# Patient Record
Sex: Female | Born: 1985 | Race: White | Hispanic: No | Marital: Single | Smoking: Current every day smoker
Health system: Southern US, Community
[De-identification: ages and names within clinical notes are randomized; demographics above are authoritative.]

## PROBLEM LIST (undated history)

## (undated) DIAGNOSIS — F32A Depression, unspecified: Secondary | ICD-10-CM

## (undated) DIAGNOSIS — Z8619 Personal history of other infectious and parasitic diseases: Secondary | ICD-10-CM

## (undated) DIAGNOSIS — Z8711 Personal history of peptic ulcer disease: Secondary | ICD-10-CM

## (undated) DIAGNOSIS — F1910 Other psychoactive substance abuse, uncomplicated: Secondary | ICD-10-CM

## (undated) DIAGNOSIS — F1920 Other psychoactive substance dependence, uncomplicated: Secondary | ICD-10-CM

## (undated) DIAGNOSIS — E785 Hyperlipidemia, unspecified: Secondary | ICD-10-CM

## (undated) DIAGNOSIS — R519 Headache, unspecified: Secondary | ICD-10-CM

## (undated) DIAGNOSIS — J45909 Unspecified asthma, uncomplicated: Secondary | ICD-10-CM

## (undated) DIAGNOSIS — M1990 Unspecified osteoarthritis, unspecified site: Secondary | ICD-10-CM

## (undated) HISTORY — DX: Personal history of peptic ulcer disease: Z87.11

## (undated) HISTORY — DX: Depression, unspecified: F32.A

## (undated) HISTORY — PX: NOSE SURGERY: SHX723

## (undated) HISTORY — DX: Unspecified osteoarthritis, unspecified site: M19.90

## (undated) HISTORY — PX: CHOLECYSTECTOMY: SHX55

## (undated) HISTORY — DX: Headache, unspecified: R51.9

## (undated) HISTORY — DX: Personal history of other infectious and parasitic diseases: Z86.19

## (undated) HISTORY — DX: Hyperlipidemia, unspecified: E78.5

## (undated) HISTORY — DX: Other psychoactive substance abuse, uncomplicated: F19.10

## (undated) HISTORY — PX: GANGLION CYST EXCISION: SHX1691

---

## 2004-09-23 ENCOUNTER — Emergency Department: Payer: Self-pay | Admitting: Emergency Medicine

## 2008-04-16 ENCOUNTER — Emergency Department: Payer: Self-pay | Admitting: Emergency Medicine

## 2008-12-25 ENCOUNTER — Emergency Department: Payer: Self-pay | Admitting: Emergency Medicine

## 2009-07-13 ENCOUNTER — Emergency Department: Payer: Self-pay

## 2009-10-25 ENCOUNTER — Emergency Department: Payer: Self-pay | Admitting: Emergency Medicine

## 2009-12-03 ENCOUNTER — Inpatient Hospital Stay: Payer: Self-pay | Admitting: Unknown Physician Specialty

## 2009-12-19 ENCOUNTER — Inpatient Hospital Stay: Payer: Self-pay | Admitting: Unknown Physician Specialty

## 2010-02-08 ENCOUNTER — Emergency Department: Payer: Self-pay | Admitting: Internal Medicine

## 2010-11-01 ENCOUNTER — Emergency Department: Payer: Self-pay | Admitting: Emergency Medicine

## 2011-10-09 ENCOUNTER — Emergency Department: Payer: Self-pay | Admitting: Emergency Medicine

## 2011-10-09 LAB — COMPREHENSIVE METABOLIC PANEL
Albumin: 3.6 g/dL (ref 3.4–5.0)
Anion Gap: 10 (ref 7–16)
BUN: 12 mg/dL (ref 7–18)
Bilirubin,Total: 0.1 mg/dL — ABNORMAL LOW (ref 0.2–1.0)
Chloride: 106 mmol/L (ref 98–107)
Co2: 28 mmol/L (ref 21–32)
Creatinine: 0.76 mg/dL (ref 0.60–1.30)
EGFR (African American): >60
EGFR (Non-African Amer.): >60
Osmolality: 287 (ref 275–301)
Potassium: 4.3 mmol/L (ref 3.5–5.1)
SGPT (ALT): 17 U/L
Sodium: 144 mmol/L (ref 136–145)
Total Protein: 7.6 g/dL (ref 6.4–8.2)

## 2011-10-09 LAB — ETHANOL: Ethanol: <3 mg/dL

## 2011-10-09 LAB — CBC
HCT: 39.5 % (ref 35.0–47.0)
HGB: 13.1 g/dL (ref 12.0–16.0)
MCV: 96 fL (ref 80–100)
RBC: 4.1 10*6/uL (ref 3.80–5.20)
RDW: 14.7 % — ABNORMAL HIGH (ref 11.5–14.5)
WBC: 7.6 10*3/uL (ref 3.6–11.0)

## 2011-10-09 LAB — TSH: Thyroid Stimulating Horm: 4 u[IU]/mL

## 2011-10-09 LAB — ACETAMINOPHEN LEVEL: Acetaminophen: <2 ug/mL

## 2012-02-28 ENCOUNTER — Emergency Department: Payer: Self-pay | Admitting: Emergency Medicine

## 2012-02-28 LAB — COMPREHENSIVE METABOLIC PANEL
Anion Gap: 8 (ref 7–16)
Bilirubin,Total: 0.2 mg/dL (ref 0.2–1.0)
EGFR (African American): >60
EGFR (Non-African Amer.): >60
Glucose: 117 mg/dL — ABNORMAL HIGH (ref 65–99)
Osmolality: 286 (ref 275–301)
Potassium: 3.5 mmol/L (ref 3.5–5.1)
SGOT(AST): 34 U/L (ref 15–37)
SGPT (ALT): 45 U/L
Sodium: 142 mmol/L (ref 136–145)
Total Protein: 7.8 g/dL (ref 6.4–8.2)

## 2012-02-28 LAB — DRUG SCREEN, URINE
Amphetamines, Ur Screen: NEGATIVE (ref ?–1000)
Barbiturates, Ur Screen: NEGATIVE (ref ?–200)
Benzodiazepine, Ur Scrn: NEGATIVE (ref ?–200)
Cocaine Metabolite,Ur ~~LOC~~: POSITIVE (ref ?–300)
Opiate, Ur Screen: NEGATIVE (ref ?–300)
Tricyclic, Ur Screen: NEGATIVE (ref ?–1000)

## 2012-02-28 LAB — URINALYSIS, COMPLETE
Bacteria: NONE SEEN
Blood: NEGATIVE
Glucose,UR: NEGATIVE mg/dL (ref 0–75)
Ketone: NEGATIVE
Ph: 5 (ref 4.5–8.0)
RBC,UR: 6 /HPF (ref 0–5)

## 2012-02-28 LAB — CBC
MCV: 97 fL (ref 80–100)
Platelet: 395 10*3/uL (ref 150–440)
RBC: 3.94 10*6/uL (ref 3.80–5.20)

## 2012-02-28 LAB — ETHANOL: Ethanol %: <0.003 % (ref 0.000–0.080)

## 2012-04-08 LAB — CBC
HCT: 31.9 % — ABNORMAL LOW (ref 35.0–47.0)
HGB: 10.3 g/dL — ABNORMAL LOW (ref 12.0–16.0)
MCHC: 32.3 g/dL (ref 32.0–36.0)
RBC: 3.27 10*6/uL — ABNORMAL LOW (ref 3.80–5.20)
WBC: 8.4 10*3/uL (ref 3.6–11.0)

## 2012-04-08 LAB — COMPREHENSIVE METABOLIC PANEL
Albumin: 3.3 g/dL — ABNORMAL LOW (ref 3.4–5.0)
Alkaline Phosphatase: 57 U/L (ref 50–136)
BUN: 22 mg/dL — ABNORMAL HIGH (ref 7–18)
Calcium, Total: 8.2 mg/dL — ABNORMAL LOW (ref 8.5–10.1)
Co2: 27 mmol/L (ref 21–32)
Creatinine: 1.04 mg/dL (ref 0.60–1.30)
Glucose: 107 mg/dL — ABNORMAL HIGH (ref 65–99)
Osmolality: 293 (ref 275–301)
Potassium: 3.1 mmol/L — ABNORMAL LOW (ref 3.5–5.1)
SGOT(AST): 29 U/L (ref 15–37)
SGPT (ALT): 38 U/L
Sodium: 145 mmol/L (ref 136–145)

## 2012-04-08 LAB — TSH: Thyroid Stimulating Horm: 3.08 u[IU]/mL

## 2012-04-08 LAB — ETHANOL
Ethanol %: <0.003 % (ref 0.000–0.080)
Ethanol: <3 mg/dL

## 2012-04-08 LAB — ACETAMINOPHEN LEVEL: Acetaminophen: <2 ug/mL

## 2012-04-08 LAB — SALICYLATE LEVEL: Salicylates, Serum: <1.7 mg/dL

## 2012-04-09 ENCOUNTER — Inpatient Hospital Stay: Payer: Self-pay | Admitting: Psychiatry

## 2012-04-09 LAB — POTASSIUM: Potassium: 3.3 mmol/L — ABNORMAL LOW (ref 3.5–5.1)

## 2012-04-09 LAB — DRUG SCREEN, URINE
Barbiturates, Ur Screen: NEGATIVE (ref ?–200)
Benzodiazepine, Ur Scrn: NEGATIVE (ref ?–200)
Cannabinoid 50 Ng, Ur ~~LOC~~: NEGATIVE (ref ?–50)
Phencyclidine (PCP) Ur S: NEGATIVE (ref ?–25)

## 2012-04-09 LAB — URINALYSIS, COMPLETE
Nitrite: NEGATIVE
Protein: NEGATIVE
RBC,UR: 2 /HPF (ref 0–5)
Squamous Epithelial: 6

## 2012-04-09 LAB — PREGNANCY, URINE: Pregnancy Test, Urine: NEGATIVE m[IU]/mL

## 2012-04-10 LAB — LIPID PANEL
HDL Cholesterol: 40 mg/dL (ref 40–60)
Triglycerides: 117 mg/dL (ref 0–200)

## 2012-04-26 ENCOUNTER — Inpatient Hospital Stay: Payer: Self-pay | Admitting: Psychiatry

## 2012-04-26 LAB — CBC
HCT: 35.7 % (ref 35.0–47.0)
HGB: 12.2 g/dL (ref 12.0–16.0)
MCH: 32.3 pg (ref 26.0–34.0)
MCHC: 34 g/dL (ref 32.0–36.0)
MCV: 95 fL (ref 80–100)
Platelet: 379 10*3/uL (ref 150–440)
RBC: 3.77 10*6/uL — ABNORMAL LOW (ref 3.80–5.20)
RDW: 14.6 % — ABNORMAL HIGH (ref 11.5–14.5)
WBC: 12.1 10*3/uL — ABNORMAL HIGH (ref 3.6–11.0)

## 2012-04-26 LAB — VALPROIC ACID LEVEL: Valproic Acid: 19 ug/mL — ABNORMAL LOW

## 2012-04-26 LAB — ACETAMINOPHEN LEVEL: Acetaminophen: <2 ug/mL

## 2012-04-26 LAB — COMPREHENSIVE METABOLIC PANEL
Anion Gap: 8 (ref 7–16)
BUN: 22 mg/dL — ABNORMAL HIGH (ref 7–18)
Chloride: 105 mmol/L (ref 98–107)
Co2: 24 mmol/L (ref 21–32)
Creatinine: 0.83 mg/dL (ref 0.60–1.30)
EGFR (African American): >60
Potassium: 3.7 mmol/L (ref 3.5–5.1)
SGOT(AST): 38 U/L — ABNORMAL HIGH (ref 15–37)
Total Protein: 8.1 g/dL (ref 6.4–8.2)

## 2012-04-26 LAB — TSH: Thyroid Stimulating Horm: 3.14 u[IU]/mL

## 2012-04-26 LAB — ETHANOL
Ethanol %: <0.003 % (ref 0.000–0.080)
Ethanol: <3 mg/dL

## 2012-04-27 LAB — WBC: WBC: 6.5 10*3/uL (ref 3.6–11.0)

## 2012-04-27 LAB — URINALYSIS, COMPLETE
Bacteria: NONE SEEN
Bilirubin,UR: NEGATIVE
Blood: NEGATIVE
Glucose,UR: NEGATIVE mg/dL (ref 0–75)
Ketone: NEGATIVE
Leukocyte Esterase: NEGATIVE
Nitrite: NEGATIVE
Ph: 7 (ref 4.5–8.0)
Squamous Epithelial: 4
WBC UR: <1 /HPF (ref 0–5)

## 2012-05-01 LAB — VALPROIC ACID LEVEL: Valproic Acid: 61 ug/mL

## 2012-10-24 ENCOUNTER — Inpatient Hospital Stay: Payer: Self-pay | Admitting: Psychiatry

## 2012-10-24 LAB — CBC WITH DIFFERENTIAL/PLATELET
Eosinophil %: 0.7 %
HCT: 35 % (ref 35.0–47.0)
HGB: 11.7 g/dL — ABNORMAL LOW (ref 12.0–16.0)
Lymphocyte #: 3.2 10*3/uL (ref 1.0–3.6)
Lymphocyte %: 23.8 %
MCH: 32.1 pg (ref 26.0–34.0)
MCHC: 33.5 g/dL (ref 32.0–36.0)
MCV: 96 fL (ref 80–100)
Monocyte %: 5.2 %
Neutrophil #: 9.3 10*3/uL — ABNORMAL HIGH (ref 1.4–6.5)
RBC: 3.65 10*6/uL — ABNORMAL LOW (ref 3.80–5.20)
WBC: 13.4 10*3/uL — ABNORMAL HIGH (ref 3.6–11.0)

## 2012-10-24 LAB — DIFFERENTIAL
Basophil %: 0.9 %
Eosinophil #: 0 10*3/uL (ref 0.0–0.7)
Eosinophil %: 0.2 %
Lymphocyte %: 11.3 %
Monocyte #: 0.8 x10 3/mm (ref 0.2–0.9)
Monocyte %: 3.9 %

## 2012-10-24 LAB — COMPREHENSIVE METABOLIC PANEL
Alkaline Phosphatase: 87 U/L (ref 50–136)
Anion Gap: 9 (ref 7–16)
Calcium, Total: 8.5 mg/dL (ref 8.5–10.1)
Co2: 21 mmol/L (ref 21–32)
Creatinine: 0.65 mg/dL (ref 0.60–1.30)
EGFR (Non-African Amer.): >60
Glucose: 99 mg/dL (ref 65–99)
Osmolality: 278 (ref 275–301)
SGOT(AST): 46 U/L — ABNORMAL HIGH (ref 15–37)
SGPT (ALT): 55 U/L (ref 12–78)
Sodium: 140 mmol/L (ref 136–145)

## 2012-10-24 LAB — URINALYSIS, COMPLETE
Bilirubin,UR: NEGATIVE
Blood: NEGATIVE
Glucose,UR: NEGATIVE mg/dL (ref 0–75)
Leukocyte Esterase: NEGATIVE
Ph: 6 (ref 4.5–8.0)
Protein: 30
RBC,UR: 4 /HPF (ref 0–5)

## 2012-10-24 LAB — CBC
HCT: 38.2 % (ref 35.0–47.0)
HGB: 12.9 g/dL (ref 12.0–16.0)
MCV: 94 fL (ref 80–100)
Platelet: 415 10*3/uL (ref 150–440)
RBC: 4.05 10*6/uL (ref 3.80–5.20)
WBC: 20 10*3/uL — ABNORMAL HIGH (ref 3.6–11.0)

## 2012-10-24 LAB — DRUG SCREEN, URINE
Amphetamines, Ur Screen: NEGATIVE (ref ?–1000)
Barbiturates, Ur Screen: NEGATIVE (ref ?–200)
Tricyclic, Ur Screen: NEGATIVE (ref ?–1000)

## 2012-10-24 LAB — ETHANOL
Ethanol %: <0.003 % (ref 0.000–0.080)
Ethanol: <3 mg/dL

## 2012-10-24 LAB — ACETAMINOPHEN LEVEL: Acetaminophen: 7 ug/mL — ABNORMAL LOW

## 2012-10-24 LAB — TSH: Thyroid Stimulating Horm: 2.56 u[IU]/mL

## 2012-10-24 LAB — SALICYLATE LEVEL: Salicylates, Serum: 3 mg/dL — ABNORMAL HIGH

## 2012-10-28 LAB — VALPROIC ACID LEVEL: Valproic Acid: 89 ug/mL

## 2012-12-20 ENCOUNTER — Inpatient Hospital Stay: Payer: Self-pay | Admitting: Psychiatry

## 2012-12-20 LAB — URINALYSIS, COMPLETE
Bilirubin,UR: NEGATIVE
Glucose,UR: NEGATIVE mg/dL (ref 0–75)
Ketone: NEGATIVE
Nitrite: POSITIVE
Ph: 5 (ref 4.5–8.0)
RBC,UR: 1 /HPF (ref 0–5)
Squamous Epithelial: 2

## 2012-12-20 LAB — COMPREHENSIVE METABOLIC PANEL
Alkaline Phosphatase: 66 U/L (ref 50–136)
Chloride: 108 mmol/L — ABNORMAL HIGH (ref 98–107)
Co2: 25 mmol/L (ref 21–32)
Creatinine: 0.89 mg/dL (ref 0.60–1.30)
Glucose: 106 mg/dL — ABNORMAL HIGH (ref 65–99)
Osmolality: 283 (ref 275–301)
SGOT(AST): 28 U/L (ref 15–37)
SGPT (ALT): 45 U/L (ref 12–78)
Sodium: 141 mmol/L (ref 136–145)
Total Protein: 7.9 g/dL (ref 6.4–8.2)

## 2012-12-20 LAB — CBC
HCT: 41.5 % (ref 35.0–47.0)
HGB: 13.9 g/dL (ref 12.0–16.0)
MCH: 33.1 pg (ref 26.0–34.0)
MCHC: 33.6 g/dL (ref 32.0–36.0)
MCV: 99 fL (ref 80–100)
RDW: 14.7 % — ABNORMAL HIGH (ref 11.5–14.5)
WBC: 7.1 10*3/uL (ref 3.6–11.0)

## 2012-12-20 LAB — DRUG SCREEN, URINE
Barbiturates, Ur Screen: NEGATIVE (ref ?–200)
Benzodiazepine, Ur Scrn: NEGATIVE (ref ?–200)
MDMA (Ecstasy)Ur Screen: NEGATIVE (ref ?–500)
Opiate, Ur Screen: NEGATIVE (ref ?–300)

## 2012-12-20 LAB — TSH: Thyroid Stimulating Horm: 1.79 u[IU]/mL

## 2012-12-20 LAB — ETHANOL
Ethanol %: <0.003 % (ref 0.000–0.080)
Ethanol: <3 mg/dL

## 2012-12-22 LAB — URINE CULTURE

## 2012-12-24 LAB — VALPROIC ACID LEVEL: Valproic Acid: 63 ug/mL

## 2013-03-17 ENCOUNTER — Emergency Department (HOSPITAL_COMMUNITY)
Admission: EM | Admit: 2013-03-17 | Discharge: 2013-03-17 | Disposition: A | Discharge status: Home / Self Care | Payer: Self-pay | Attending: Emergency Medicine | Admitting: Emergency Medicine

## 2013-03-17 ENCOUNTER — Encounter (HOSPITAL_COMMUNITY): Payer: Self-pay | Admitting: Emergency Medicine

## 2013-03-17 DIAGNOSIS — B070 Plantar wart: Secondary | ICD-10-CM | POA: Insufficient documentation

## 2013-03-17 DIAGNOSIS — F17200 Nicotine dependence, unspecified, uncomplicated: Secondary | ICD-10-CM | POA: Insufficient documentation

## 2013-03-17 DIAGNOSIS — G8929 Other chronic pain: Secondary | ICD-10-CM | POA: Insufficient documentation

## 2013-03-17 NOTE — ED Notes (Signed)
Pt c/o pain in left foot and states "I have a plantar wart on the bottom of my foot and it's bothering me". Pt also reprots sharp shooting pain on the side of her foot.

## 2013-03-17 NOTE — ED Provider Notes (Signed)
   History    CSN: [id number] Arrival date & time 03/17/13  1478  First MD Initiated Contact with Patient 03/17/13 2008     Chief Complaint  Patient presents with  . Foot Pain   (Consider location/radiation/quality/duration/timing/severity/associated sxs/prior Treatment) Patient is a 27 y.o. female presenting with lower extremity pain. The history is provided by the patient.  Foot Pain This is a chronic problem. The current episode started more than 1 year ago. The problem has been gradually worsening. Pertinent negatives include no chills, coughing, fever, nausea, neck pain or vomiting. The symptoms are aggravated by standing and walking. She has tried nothing for the symptoms.   Rebecca Mcclure is a 27 y.o. female who presents to the ED with "plantar's wart" to the left foot. It has been there for years but recently became painful.    History reviewed. No pertinent past medical history. History reviewed. No pertinent past surgical history. No family history on file. History  Substance Use Topics  . Smoking status: Current Every Day Smoker  . Smokeless tobacco: Not on file  . Alcohol Use: No   OB History   Grav Para Term Preterm Abortions TAB SAB Ect Mult Living                 Review of Systems  Constitutional: Negative for fever and chills.  HENT: Negative for neck pain.   Respiratory: Negative for cough.   Gastrointestinal: Negative for nausea and vomiting.  Musculoskeletal: Gait problem: due to pain.       Left foot pain  Skin: Wound: plantar wart.  Psychiatric/Behavioral: The patient is not nervous/anxious.     Allergies  Review of patient's allergies indicates no known allergies.  Home Medications   Current Outpatient Rx  Name  Route  Sig  Dispense  Refill  . divalproex (DEPAKOTE) 500 MG DR tablet   Oral   Take 500 mg by mouth 2 (two) times daily.          BP 128/76  Pulse 91  Temp(Src) 98.4 F (36.9 C) (Oral)  Resp 24  Ht 5\' 4"  (1.626 m)  Wt 140 lb  (63.504 kg)  BMI 24.02 kg/m2  SpO2 100%  LMP 02/14/2013 Physical Exam  Nursing note and vitals reviewed. Constitutional: She is oriented to person, place, and time. She appears well-developed and well-nourished. No distress.  HENT:  Head: Normocephalic.  Eyes: EOM are normal.  Neck: Neck supple.  Cardiovascular: Normal rate.   Pulmonary/Chest: Effort normal.  Musculoskeletal:       Left foot: She exhibits tenderness (plantar aspect). She exhibits normal range of motion, no swelling, normal capillary refill and no deformity.       Feet:  Approximately 2 cm tender raised firm area plantar aspect of left foot at base of 4th and 5th toes.   Neurological: She is alert and oriented to person, place, and time. No cranial nerve deficit.  Skin: Skin is warm and dry.  Psychiatric: She has a normal mood and affect. Her behavior is normal.    ED Course  Procedures  MDM  27 y.o. female with plantar wart of left foot. Will try wart pad for comfort and follow up with Dr. Charlsie Merles.  Discussed with the patient clinical findings and plan of care and all questioned fully answered.    Janne Napoleon, NP 03/17/13 2025

## 2013-03-17 NOTE — ED Notes (Signed)
Patient c/o "plantar's wart" on left foot.  Patient states it has been there for years, but recently started getting painful.

## 2013-03-18 NOTE — ED Provider Notes (Signed)
Medical screening examination/treatment/procedure(s) were performed by non-physician practitioner and as supervising physician I was immediately available for consultation/collaboration.   Carleene Cooper III, MD 03/18/13 [phone]

## 2013-06-20 ENCOUNTER — Encounter (HOSPITAL_COMMUNITY): Payer: Self-pay | Admitting: *Deleted

## 2013-06-20 ENCOUNTER — Emergency Department (HOSPITAL_COMMUNITY)
Admission: EM | Admit: 2013-06-20 | Discharge: 2013-06-21 | Disposition: A | Discharge status: Home / Self Care | Payer: Self-pay | Attending: Emergency Medicine | Admitting: Emergency Medicine

## 2013-06-20 DIAGNOSIS — Z0283 Encounter for blood-alcohol and blood-drug test: Secondary | ICD-10-CM

## 2013-06-20 DIAGNOSIS — K5289 Other specified noninfective gastroenteritis and colitis: Secondary | ICD-10-CM | POA: Insufficient documentation

## 2013-06-20 DIAGNOSIS — Z79899 Other long term (current) drug therapy: Secondary | ICD-10-CM | POA: Insufficient documentation

## 2013-06-20 DIAGNOSIS — Z1389 Encounter for screening for other disorder: Secondary | ICD-10-CM | POA: Insufficient documentation

## 2013-06-20 DIAGNOSIS — F17200 Nicotine dependence, unspecified, uncomplicated: Secondary | ICD-10-CM | POA: Insufficient documentation

## 2013-06-20 DIAGNOSIS — K529 Noninfective gastroenteritis and colitis, unspecified: Secondary | ICD-10-CM

## 2013-06-20 NOTE — ED Notes (Signed)
Pt c/o diarrhea and nausea since last night. 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 States there is an ingredient in Zantac that causes the false positive.

## 2013-06-20 NOTE — ED Provider Notes (Signed)
CSN: [id number]     Arrival date & time 06/20/13  2226 History   First MD Initiated Contact with Patient 06/20/13 2248     Chief Complaint  Patient presents with  . Diarrhea  . 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 She request a drug screen to prevent being forced out of the half way house.  Patient is a 27 y.o. female presenting with diarrhea. The history is provided by the patient.  Diarrhea Quality:  Unable to specify Severity:  Moderate Onset quality:  Gradual Duration:  1 day Timing:  Intermittent Progression:  Worsening Relieved by:  Nothing Ineffective treatments:  None tried Associated symptoms: no abdominal pain and no arthralgias   Risk factors: no recent antibiotic use and no sick contacts     History reviewed. No pertinent past medical history. History reviewed. No pertinent past surgical history. History reviewed. No pertinent family history. History  Substance Use Topics  . Smoking status: Current Every Day Smoker  . Smokeless tobacco: Not on file  . Alcohol Use: No   OB History   Grav Para Term Preterm Abortions TAB SAB Ect Mult Living                 Review of Systems  Constitutional: Negative for activity change.       All ROS Neg except as noted in HPI  HENT: Negative for nosebleeds and neck pain.   Eyes: Negative for photophobia and discharge.  Respiratory: Negative for cough, shortness of breath and wheezing.   Cardiovascular: Negative for chest pain and palpitations.  Gastrointestinal: Positive for nausea and diarrhea. Negative for abdominal pain and blood in stool.  Genitourinary: Negative for dysuria, frequency and hematuria.  Musculoskeletal: Negative for back pain and arthralgias.  Skin: Negative.   Neurological: Negative for dizziness, seizures and speech difficulty.   Psychiatric/Behavioral: Negative for hallucinations and confusion.    Allergies  Review of patient's allergies indicates no known allergies.  Home Medications   Current Outpatient Rx  Name  Route  Sig  Dispense  Refill  . divalproex (DEPAKOTE) 500 MG DR tablet   Oral   Take 500 mg by mouth 2 (two) times daily.         . ranitidine (ZANTAC) 150 MG tablet   Oral   Take 150 mg by mouth 2 (two) times daily.          BP 117/75  Pulse 83  Temp(Src) 98.3 F (36.8 C) (Oral)  Resp 24  Ht 5\' 3"  (1.6 m)  Wt 155 lb 11.2 oz (70.625 kg)  BMI 27.59 kg/m2  SpO2 99%  LMP 06/16/2013 Physical Exam  Nursing note and vitals reviewed. Constitutional: She is oriented to person, place, and time. She appears well-developed and well-nourished.  Non-toxic appearance.  HENT:  Head: Normocephalic.  Right Ear: Tympanic membrane and external ear normal.  Left Ear: Tympanic membrane and external ear normal.  Eyes: EOM and lids are normal. Pupils are equal, round, and reactive to light.  Neck: Normal range of motion. Neck supple. Carotid bruit is not present.  Cardiovascular: Normal rate, regular rhythm, normal heart sounds, intact distal pulses and normal pulses.   Pulmonary/Chest: Breath sounds normal. No respiratory distress.  Abdominal: Soft. Bowel sounds are normal. There is no tenderness. There is no guarding.  Musculoskeletal: Normal range of motion.  Lymphadenopathy:       Head (  right side): No submandibular adenopathy present.       Head (left side): No submandibular adenopathy present.    She has no cervical adenopathy.  Neurological: She is alert and oriented to person, place, and time. She has normal strength. No cranial nerve deficit or sensory deficit.  Skin: Skin is warm and dry.  Psychiatric: She has a normal mood and affect. Her speech is normal.    ED Course  Procedures (including critical care time) Labs Review Labs Reviewed - No data to display Imaging Review No  results found.  MDM  No diagnosis found. *I have reviewed nursing notes, vital signs, and all appropriate lab and imaging results for this patient.**  Requested drug screen negative. No diarrhea in ED. Zofran given in ED. No vomiting. Pt drinking liquids in ED. Safe for d/c. Pt to return if any changes or problem.  Kathie Dike, PA-C 06/22/13 [phone]

## 2013-06-21 LAB — RAPID URINE DRUG SCREEN, HOSP PERFORMED
Barbiturates: NOT DETECTED
Benzodiazepines: NOT DETECTED
Cocaine: NOT DETECTED
Opiates: NOT DETECTED
Tetrahydrocannabinol: NOT DETECTED

## 2013-06-21 MED ORDER — ONDANSETRON HCL 4 MG PO TABS
4.0000 mg | ORAL_TABLET | Freq: Once | ORAL | Status: AC
  Administered 2013-06-21: 4 mg via ORAL
  Filled 2013-06-21: qty 1

## 2013-06-24 NOTE — ED Provider Notes (Signed)
Medical screening examination/treatment/procedure(s) were performed by non-physician practitioner and as supervising physician I was immediately available for consultation/collaboration.    Vida Roller, MD 06/24/13 [phone]

## 2013-07-06 ENCOUNTER — Emergency Department (HOSPITAL_COMMUNITY)
Admission: EM | Admit: 2013-07-06 | Discharge: 2013-07-06 | Disposition: A | Discharge status: Home / Self Care | Payer: Self-pay | Attending: Emergency Medicine | Admitting: Emergency Medicine

## 2013-07-06 ENCOUNTER — Encounter (HOSPITAL_COMMUNITY): Payer: Self-pay | Admitting: Emergency Medicine

## 2013-07-06 DIAGNOSIS — IMO0002 Reserved for concepts with insufficient information to code with codable children: Secondary | ICD-10-CM | POA: Insufficient documentation

## 2013-07-06 DIAGNOSIS — Z79899 Other long term (current) drug therapy: Secondary | ICD-10-CM | POA: Insufficient documentation

## 2013-07-06 DIAGNOSIS — J029 Acute pharyngitis, unspecified: Secondary | ICD-10-CM | POA: Insufficient documentation

## 2013-07-06 DIAGNOSIS — J9801 Acute bronchospasm: Secondary | ICD-10-CM

## 2013-07-06 DIAGNOSIS — J40 Bronchitis, not specified as acute or chronic: Secondary | ICD-10-CM

## 2013-07-06 DIAGNOSIS — J45901 Unspecified asthma with (acute) exacerbation: Secondary | ICD-10-CM | POA: Insufficient documentation

## 2013-07-06 DIAGNOSIS — F17200 Nicotine dependence, unspecified, uncomplicated: Secondary | ICD-10-CM | POA: Insufficient documentation

## 2013-07-06 HISTORY — DX: Unspecified asthma, uncomplicated: J45.909

## 2013-07-06 MED ORDER — ALBUTEROL SULFATE HFA 108 (90 BASE) MCG/ACT IN AERS: 1.0000 | INHALATION_SPRAY | Freq: Four times a day (QID) | RESPIRATORY_TRACT | Status: DC | PRN

## 2013-07-06 MED ORDER — AZITHROMYCIN 250 MG PO TABS: ORAL_TABLET | ORAL | Status: DC

## 2013-07-06 MED ORDER — PREDNISONE 10 MG PO TABS: 20.0000 mg | ORAL_TABLET | Freq: Every day | ORAL | Status: DC

## 2013-07-06 MED ORDER — HYDROXYZINE HCL 25 MG PO TABS: 25.0000 mg | ORAL_TABLET | Freq: Every evening | ORAL | Status: DC | PRN

## 2013-07-06 MED ORDER — PREDNISONE 50 MG PO TABS
60.0000 mg | ORAL_TABLET | Freq: Once | ORAL | Status: AC
  Administered 2013-07-06: 60 mg via ORAL
  Filled 2013-07-06 (×2): qty 1

## 2013-07-06 MED ORDER — HYDROXYZINE HCL 25 MG PO TABS
25.0000 mg | ORAL_TABLET | Freq: Once | ORAL | Status: AC
  Administered 2013-07-06: 25 mg via ORAL

## 2013-07-06 MED ORDER — ALBUTEROL SULFATE (5 MG/ML) 0.5% IN NEBU
2.5000 mg | INHALATION_SOLUTION | RESPIRATORY_TRACT | Status: DC | PRN
  Administered 2013-07-06: 2.5 mg via RESPIRATORY_TRACT
  Filled 2013-07-06: qty 0.5

## 2013-07-06 MED ORDER — HYDROXYZINE HCL 25 MG PO TABS
ORAL_TABLET | ORAL | Status: AC
  Filled 2013-07-06: qty 1

## 2013-07-06 NOTE — ED Provider Notes (Signed)
CSN: [id number]     Arrival date & time 07/06/13  2053 History  This chart was scribed for Roney Marion, MD by Dorothey Baseman, ED Scribe. This patient was seen in room [facility]03[facility] and the patient's care was started at 9:16 PM.    Chief Complaint  Patient presents with  . Sore Throat   The history is provided by the patient. No language interpreter was used.   HPI Comments: Rebecca Mcclure is a 27 y.o. female who presents to the Emergency Department complaining of a constant sore throat onset yesterday that has been progressively worsening. She reports associated cough without sputum, shortness of breath, wheezes, chest pain, headache, rhinorrhea. She states that she had a rash to the right shin several months ago that has since resolved. She denies nausea, vomiting, diarrhea. Patient reports a history of asthma several years ago that is well-controlled.   Past Medical History  Diagnosis Date  . Asthma    History reviewed. No pertinent past surgical history. History reviewed. No pertinent family history. History  Substance Use Topics  . Smoking status: Current Every Day Smoker  . Smokeless tobacco: Not on file  . Alcohol Use: No   OB History   Grav Para Term Preterm Abortions TAB SAB Ect Mult Living                 Review of Systems  Constitutional: Negative for fever, chills, diaphoresis and fatigue.  HENT: Positive for rhinorrhea and sore throat. Negative for trouble swallowing.   Eyes: Negative for visual disturbance.  Respiratory: Positive for cough, shortness of breath and wheezing. Negative for chest tightness.   Cardiovascular: Positive for chest pain.  Gastrointestinal: Negative for nausea, vomiting, abdominal pain, diarrhea and abdominal distention.  Musculoskeletal: Negative for gait problem.  Skin: Negative for rash.  Neurological: Positive for headaches. Negative for dizziness, syncope and light-headedness.  Hematological: Does not bruise/bleed easily.   Psychiatric/Behavioral: Negative for behavioral problems and confusion.    Allergies  Review of patient's allergies indicates no known allergies.  Home Medications   Current Outpatient Rx  Name  Route  Sig  Dispense  Refill  . albuterol (PROVENTIL HFA;VENTOLIN HFA) 108 (90 BASE) MCG/ACT inhaler   Inhalation   Inhale 1-2 puffs into the lungs every 6 (six) hours as needed for wheezing.   1 Inhaler   0   . azithromycin (ZITHROMAX Z-PAK) 250 MG tablet      As directed   6 each   0   . divalproex (DEPAKOTE) 500 MG DR tablet   Oral   Take 500 mg by mouth 2 (two) times daily.         . predniSONE (DELTASONE) 10 MG tablet   Oral   Take 2 tablets (20 mg total) by mouth daily.   10 tablet   0   . ranitidine (ZANTAC) 150 MG tablet   Oral   Take 150 mg by mouth 2 (two) times daily.          Triage Vitals: BP 135/87  Pulse 88  Temp(Src) 97.9 F (36.6 C) (Oral)  Resp 20  Ht 5\' 3"  (1.6 m)  Wt 155 lb (70.308 kg)  BMI 27.46 kg/m2  SpO2 100%  LMP 06/16/2013  Physical Exam  Constitutional: She is oriented to person, place, and time. She appears well-developed and well-nourished. No distress.  HENT:  Head: Normocephalic.  No pharyngeal erythema. No tonsillar swelling.   Eyes: Conjunctivae are normal. Pupils are equal, round, and reactive to  light. No scleral icterus.  Neck: Normal range of motion. Neck supple. No thyromegaly present.  Cardiovascular: Normal rate, regular rhythm, normal heart sounds and intact distal pulses.  Exam reveals no gallop and no friction rub.   No murmur heard. Pulmonary/Chest: Effort normal and breath sounds normal. No respiratory distress. She has no wheezes. She has no rales.  Expiratory wheezing and prolongation throughout all lung fields.   Abdominal: Soft. Bowel sounds are normal. She exhibits no distension. There is no tenderness. There is no rebound.  Musculoskeletal: Normal range of motion.  Lymphadenopathy:    She has no cervical  adenopathy.  Neurological: She is alert and oriented to person, place, and time.  Skin: Skin is warm and dry. No rash noted.  Psychiatric: She has a normal mood and affect. Her behavior is normal.    ED Course  Procedures (including critical care time)  DIAGNOSTIC STUDIES: Oxygen Saturation is 100% on room air, normal by my interpretation.    COORDINATION OF CARE: 9:20 PM- Discussed that symptoms are likely due to bronchitis. Will order an albuterol breathing treatment and prednisone. Will discharge patient with prednisone and antibiotics. Discussed treatment plan with patient at bedside and patient verbalized agreement.     Labs Review Labs Reviewed - No data to display Imaging Review No results found.  EKG Interpretation   None       MDM   1. Bronchitis   2. Bronchospasm      Pt wheezing diffusely on initial exam.  Overall good air movement.  Not dyspneic at rest. Well oxygenated.  No focal lung findings. Given Neb in ER.  Plan: out pt treatment for bronchitis bronchospasm.  I personally performed the services described in this documentation, which was scribed in my presence. The recorded information has been reviewed and is accurate.     Roney Marion, MD 07/06/13 2129

## 2013-07-06 NOTE — ED Notes (Signed)
Sore throat, since yesterday, cough,

## 2013-07-06 NOTE — ED Notes (Signed)
Pt alert & oriented x4, stable gait. Patient given discharge instructions, paperwork & prescription(s). Patient  instructed to stop at the registration desk to finish any additional paperwork. Patient verbalized understanding. Pt left department w/ no further questions. 

## 2013-07-27 DIAGNOSIS — Z8719 Personal history of other diseases of the digestive system: Secondary | ICD-10-CM | POA: Insufficient documentation

## 2013-07-27 DIAGNOSIS — Z0000 Encounter for general adult medical examination without abnormal findings: Secondary | ICD-10-CM | POA: Insufficient documentation

## 2013-07-27 DIAGNOSIS — F1921 Other psychoactive substance dependence, in remission: Secondary | ICD-10-CM | POA: Insufficient documentation

## 2013-07-27 DIAGNOSIS — K219 Gastro-esophageal reflux disease without esophagitis: Secondary | ICD-10-CM | POA: Insufficient documentation

## 2014-01-03 DIAGNOSIS — K529 Noninfective gastroenteritis and colitis, unspecified: Secondary | ICD-10-CM | POA: Insufficient documentation

## 2014-01-14 DIAGNOSIS — J029 Acute pharyngitis, unspecified: Secondary | ICD-10-CM | POA: Insufficient documentation

## 2014-01-27 ENCOUNTER — Emergency Department: Payer: Self-pay | Admitting: Emergency Medicine

## 2014-01-27 LAB — COMPREHENSIVE METABOLIC PANEL
ALT: 86 U/L — AB (ref 12–78)
ANION GAP: 5 — AB (ref 7–16)
Albumin: 3.7 g/dL (ref 3.4–5.0)
Alkaline Phosphatase: 62 U/L
BUN: 15 mg/dL (ref 7–18)
Bilirubin,Total: 0.2 mg/dL (ref 0.2–1.0)
CALCIUM: 9 mg/dL (ref 8.5–10.1)
CO2: 27 mmol/L (ref 21–32)
CREATININE: 0.8 mg/dL (ref 0.60–1.30)
Chloride: 105 mmol/L (ref 98–107)
EGFR (African American): >60
EGFR (Non-African Amer.): >60
Glucose: 88 mg/dL (ref 65–99)
OSMOLALITY: 274 (ref 275–301)
POTASSIUM: 3.8 mmol/L (ref 3.5–5.1)
SGOT(AST): 52 U/L — ABNORMAL HIGH (ref 15–37)
Sodium: 137 mmol/L (ref 136–145)
Total Protein: 7.8 g/dL (ref 6.4–8.2)

## 2014-01-27 LAB — URINALYSIS, COMPLETE
BACTERIA: NONE SEEN
Bilirubin,UR: NEGATIVE
Blood: NEGATIVE
Glucose,UR: NEGATIVE mg/dL (ref 0–75)
Ketone: NEGATIVE
LEUKOCYTE ESTERASE: NEGATIVE
Nitrite: NEGATIVE
PROTEIN: NEGATIVE
Ph: 6 (ref 4.5–8.0)
RBC,UR: NONE SEEN /HPF (ref 0–5)
SPECIFIC GRAVITY: 1.021 (ref 1.003–1.030)
Squamous Epithelial: 20
WBC UR: 1 /HPF (ref 0–5)

## 2014-01-27 LAB — DRUG SCREEN, URINE
AMPHETAMINES, UR SCREEN: NEGATIVE (ref ?–1000)
BENZODIAZEPINE, UR SCRN: NEGATIVE (ref ?–200)
Barbiturates, Ur Screen: NEGATIVE (ref ?–200)
CANNABINOID 50 NG, UR ~~LOC~~: NEGATIVE (ref ?–50)
Cocaine Metabolite,Ur ~~LOC~~: POSITIVE (ref ?–300)
MDMA (Ecstasy)Ur Screen: NEGATIVE (ref ?–500)
Methadone, Ur Screen: NEGATIVE (ref ?–300)
Opiate, Ur Screen: NEGATIVE (ref ?–300)
PHENCYCLIDINE (PCP) UR S: NEGATIVE (ref ?–25)
Tricyclic, Ur Screen: NEGATIVE (ref ?–1000)

## 2014-01-27 LAB — CBC
HCT: 42.7 % (ref 35.0–47.0)
HGB: 14.2 g/dL (ref 12.0–16.0)
MCH: 32.9 pg (ref 26.0–34.0)
MCHC: 33.3 g/dL (ref 32.0–36.0)
MCV: 99 fL (ref 80–100)
Platelet: 292 10*3/uL (ref 150–440)
RBC: 4.32 10*6/uL (ref 3.80–5.20)
RDW: 13.4 % (ref 11.5–14.5)
WBC: 9.4 10*3/uL (ref 3.6–11.0)

## 2014-01-27 LAB — SALICYLATE LEVEL: Salicylates, Serum: 2.8 mg/dL

## 2014-01-27 LAB — ETHANOL: Ethanol: <3 mg/dL

## 2014-01-27 LAB — ACETAMINOPHEN LEVEL

## 2014-02-22 ENCOUNTER — Emergency Department: Payer: Self-pay | Admitting: Emergency Medicine

## 2014-02-22 LAB — CBC
HCT: 39.2 % (ref 35.0–47.0)
HGB: 12.8 g/dL (ref 12.0–16.0)
MCH: 31.5 pg (ref 26.0–34.0)
MCHC: 32.6 g/dL (ref 32.0–36.0)
MCV: 97 fL (ref 80–100)
Platelet: 313 10*3/uL (ref 150–440)
RBC: 4.06 10*6/uL (ref 3.80–5.20)
RDW: 13.6 % (ref 11.5–14.5)
WBC: 14.5 10*3/uL — ABNORMAL HIGH (ref 3.6–11.0)

## 2014-02-22 LAB — URINALYSIS, COMPLETE
Bilirubin,UR: NEGATIVE
Blood: NEGATIVE
GLUCOSE, UR: NEGATIVE mg/dL (ref 0–75)
Ketone: NEGATIVE
Nitrite: NEGATIVE
Ph: 5 (ref 4.5–8.0)
Protein: NEGATIVE
RBC, UR: NONE SEEN /HPF (ref 0–5)
Specific Gravity: 1.012 (ref 1.003–1.030)
Squamous Epithelial: 1

## 2014-02-22 LAB — COMPREHENSIVE METABOLIC PANEL
ALK PHOS: 68 U/L
Albumin: 3.1 g/dL — ABNORMAL LOW (ref 3.4–5.0)
Anion Gap: 4 — ABNORMAL LOW (ref 7–16)
BILIRUBIN TOTAL: 0.3 mg/dL (ref 0.2–1.0)
BUN: 8 mg/dL (ref 7–18)
CALCIUM: 8.7 mg/dL (ref 8.5–10.1)
CO2: 25 mmol/L (ref 21–32)
CREATININE: 0.93 mg/dL (ref 0.60–1.30)
Chloride: 105 mmol/L (ref 98–107)
EGFR (African American): >60
EGFR (Non-African Amer.): >60
Glucose: 101 mg/dL — ABNORMAL HIGH (ref 65–99)
Osmolality: 267 (ref 275–301)
Potassium: 3.7 mmol/L (ref 3.5–5.1)
SGOT(AST): 33 U/L (ref 15–37)
SGPT (ALT): 34 U/L (ref 12–78)
SODIUM: 134 mmol/L — AB (ref 136–145)
Total Protein: 7.6 g/dL (ref 6.4–8.2)

## 2014-02-22 LAB — VALPROIC ACID LEVEL: Valproic Acid: <3 ug/mL — ABNORMAL LOW

## 2014-02-24 LAB — URINE CULTURE

## 2014-04-05 ENCOUNTER — Emergency Department: Payer: Self-pay | Admitting: Emergency Medicine

## 2014-04-05 LAB — CBC
HCT: 38.2 % (ref 35.0–47.0)
HGB: 12.7 g/dL (ref 12.0–16.0)
MCH: 31.4 pg (ref 26.0–34.0)
MCHC: 33.2 g/dL (ref 32.0–36.0)
MCV: 95 fL (ref 80–100)
Platelet: 331 10*3/uL (ref 150–440)
RBC: 4.04 10*6/uL (ref 3.80–5.20)
RDW: 14.3 % (ref 11.5–14.5)
WBC: 12.2 10*3/uL — ABNORMAL HIGH (ref 3.6–11.0)

## 2014-04-05 LAB — COMPREHENSIVE METABOLIC PANEL
Albumin: 3.7 g/dL (ref 3.4–5.0)
Alkaline Phosphatase: 68 U/L
Anion Gap: 4 — ABNORMAL LOW (ref 7–16)
BILIRUBIN TOTAL: 0.5 mg/dL (ref 0.2–1.0)
BUN: 17 mg/dL (ref 7–18)
CREATININE: 0.94 mg/dL (ref 0.60–1.30)
Calcium, Total: 8.6 mg/dL (ref 8.5–10.1)
Chloride: 111 mmol/L — ABNORMAL HIGH (ref 98–107)
Co2: 26 mmol/L (ref 21–32)
EGFR (Non-African Amer.): >60
Glucose: 89 mg/dL (ref 65–99)
Osmolality: 282 (ref 275–301)
Potassium: 3.5 mmol/L (ref 3.5–5.1)
SGOT(AST): 47 U/L — ABNORMAL HIGH (ref 15–37)
SGPT (ALT): 40 U/L (ref 12–78)
Sodium: 141 mmol/L (ref 136–145)
Total Protein: 7.6 g/dL (ref 6.4–8.2)

## 2014-04-05 LAB — ETHANOL: Ethanol: <3 mg/dL

## 2014-05-26 ENCOUNTER — Encounter (HOSPITAL_BASED_OUTPATIENT_CLINIC_OR_DEPARTMENT_OTHER): Payer: Self-pay | Admitting: Emergency Medicine

## 2014-05-26 ENCOUNTER — Emergency Department (HOSPITAL_BASED_OUTPATIENT_CLINIC_OR_DEPARTMENT_OTHER)
Admission: EM | Admit: 2014-05-26 | Discharge: 2014-05-26 | Disposition: A | Discharge status: Home / Self Care | Payer: Self-pay | Attending: Emergency Medicine | Admitting: Emergency Medicine

## 2014-05-26 DIAGNOSIS — G43019 Migraine without aura, intractable, without status migrainosus: Secondary | ICD-10-CM

## 2014-05-26 DIAGNOSIS — Z79899 Other long term (current) drug therapy: Secondary | ICD-10-CM | POA: Insufficient documentation

## 2014-05-26 DIAGNOSIS — J45909 Unspecified asthma, uncomplicated: Secondary | ICD-10-CM | POA: Insufficient documentation

## 2014-05-26 DIAGNOSIS — R51 Headache: Secondary | ICD-10-CM | POA: Insufficient documentation

## 2014-05-26 DIAGNOSIS — F17200 Nicotine dependence, unspecified, uncomplicated: Secondary | ICD-10-CM | POA: Insufficient documentation

## 2014-05-26 DIAGNOSIS — IMO0002 Reserved for concepts with insufficient information to code with codable children: Secondary | ICD-10-CM | POA: Insufficient documentation

## 2014-05-26 DIAGNOSIS — G43919 Migraine, unspecified, intractable, without status migrainosus: Secondary | ICD-10-CM | POA: Insufficient documentation

## 2014-05-26 MED ORDER — METOCLOPRAMIDE HCL 5 MG/ML IJ SOLN
10.0000 mg | Freq: Once | INTRAMUSCULAR | Status: AC
  Administered 2014-05-26: 10 mg via INTRAVENOUS
  Filled 2014-05-26: qty 2

## 2014-05-26 MED ORDER — DIPHENHYDRAMINE HCL 50 MG/ML IJ SOLN
25.0000 mg | Freq: Once | INTRAMUSCULAR | Status: AC
  Administered 2014-05-26: 25 mg via INTRAVENOUS
  Filled 2014-05-26: qty 1

## 2014-05-26 MED ORDER — SODIUM CHLORIDE 0.9 % IV SOLN
1000.0000 mL | Freq: Once | INTRAVENOUS | Status: AC
  Administered 2014-05-26: 1000 mL via INTRAVENOUS

## 2014-05-26 MED ORDER — SODIUM CHLORIDE 0.9 % IV SOLN
1000.0000 mL | INTRAVENOUS | Status: DC
  Administered 2014-05-26: 1000 mL via INTRAVENOUS

## 2014-05-26 MED ORDER — METOCLOPRAMIDE HCL 10 MG PO TABS: 10.0000 mg | ORAL_TABLET | Freq: Four times a day (QID) | ORAL | Status: DC | PRN

## 2014-05-26 NOTE — ED Notes (Signed)
Patient reports that she is feeling very weak and dizzy. Patient states that her head is pounding had been throwing up for the last hour.

## 2014-05-26 NOTE — ED Notes (Signed)
MD at bedside. 

## 2014-05-26 NOTE — ED Notes (Addendum)
Responded to call light.  Patient rolling around in the bed demanding a warm blanket because she is so cold.  States we are not doing anything to help her.  Three warm blankets given, and wrapped around patient and room temperature turned up to 74.  Patient screamed that she needs to have the room completely dark and more narcotic medication.  Placed emesis bag on the bed next to patient, accidentally hit her arm with the IV.  Patient screamed that I hit her IV site on purpose. This patient stated that she smells liqueur, assurance given that our hand cleaner is a alcohol based cleaner.  Patient screamed that she did not want me to come back into her room and that she would call out for someone else to come and help her.

## 2014-05-26 NOTE — ED Provider Notes (Signed)
CSN: [id number]     Arrival date & time 05/26/14  1706 History   First MD Initiated Contact with Patient 05/26/14 1739     Chief Complaint  Patient presents with  . Headache  . Emesis     (Consider location/radiation/quality/duration/timing/severity/associated sxs/prior Treatment) Patient is a 28 y.o. female presenting with headaches and vomiting. The history is provided by the patient.  Headache Associated symptoms: vomiting   Emesis Associated symptoms: headaches   She had onset about 2 PM of severe, throbbing, pounding global headache with associated nausea and vomiting. There has been some blurred vision but no focal weakness or numbness. She denies fever or chills. She has had headaches similar to this in the past. Headache is worse with exposure to light or noise. Nothing makes it better. She rates pain a 10/10. She denies any aura.  Past Medical History  Diagnosis Date  . Asthma    History reviewed. No pertinent past surgical history. History reviewed. No pertinent family history. History  Substance Use Topics  . Smoking status: Current Every Day Smoker  . Smokeless tobacco: Not on file  . Alcohol Use: No   OB History   Grav Para Term Preterm Abortions TAB SAB Ect Mult Living                 Review of Systems  Gastrointestinal: Positive for vomiting.  Neurological: Positive for headaches.  All other systems reviewed and are negative.     Allergies  Review of patient's allergies indicates no known allergies.  Home Medications   Prior to Admission medications   Medication Sig Start Date End Date Taking? Authorizing Provider  albuterol (PROVENTIL HFA;VENTOLIN HFA) 108 (90 BASE) MCG/ACT inhaler Inhale 1-2 puffs into the lungs every 6 (six) hours as needed for wheezing. 07/06/13   Rolland Porter, MD  azithromycin (ZITHROMAX Z-PAK) 250 MG tablet As directed 07/06/13   Rolland Porter, MD  divalproex (DEPAKOTE) 500 MG DR tablet Take 500 mg by mouth at bedtime.     Historical  Provider, MD  hydrOXYzine (ATARAX/VISTARIL) 25 MG tablet Take 1 tablet (25 mg total) by mouth at bedtime as needed (sleep). 07/06/13   Rolland Porter, MD  ibuprofen (ADVIL,MOTRIN) 200 MG tablet Take 200 mg by mouth every 6 (six) hours as needed for pain.    Historical Provider, MD  predniSONE (DELTASONE) 10 MG tablet Take 2 tablets (20 mg total) by mouth daily. 07/06/13   Rolland Porter, MD  ranitidine (ZANTAC) 150 MG tablet Take 150 mg by mouth 2 (two) times daily.    Historical Provider, MD   BP 138/78  Pulse 94  Temp(Src) 98.5 F (36.9 C) (Oral)  Resp 18  SpO2 100%  LMP 05/11/2014 Physical Exam  Nursing note and vitals reviewed.  28 year old female, who appears uncomfortable, but is in no acute distress. Vital signs are normal. Oxygen saturation is 100%, which is normal. Head is normocephalic and atraumatic. PERRLA, EOMI. Oropharynx is clear. Fundi show no hemorrhage, exudate, or papilledema. Neck is nontender and supple without adenopathy or JVD. Back is nontender and there is no CVA tenderness. Lungs are clear without rales, wheezes, or rhonchi. Chest is nontender. Heart has regular rate and rhythm without murmur. Abdomen is soft, flat, nontender without masses or hepatosplenomegaly and peristalsis is normoactive. Extremities have no cyanosis or edema, full range of motion is present. Skin is warm and dry without rash. Neurologic: Mental status is normal, cranial nerves are intact, there are no motor or sensory deficits.  ED Course  Procedures (including critical care time)  MDM   Final diagnoses:  Intractable migraine without aura and without status migrainosus    Headache which seems to be a migraine headache. 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 She will be given intravenous fluids, and metoclopramide, and diphenhydramine.  She got good relief of headache with above noted treatment and is discharged with prescription for  metoclopramide.  Dione Booze, MD 05/26/14 [phone]

## 2014-05-26 NOTE — Discharge Instructions (Signed)
Migraine Headache A migraine headache is an intense, throbbing pain on one or both sides of your head. A migraine can last for 30 minutes to several hours. CAUSES  The exact cause of a migraine headache is not always known. However, a migraine may be caused when nerves in the brain become irritated and release chemicals that cause inflammation. This causes pain. Certain things may also trigger migraines, such as:  Alcohol.  Smoking.  Stress.  Menstruation.  Aged cheeses.  Foods or drinks that contain nitrates, glutamate, aspartame, or tyramine.  Lack of sleep.  Chocolate.  Caffeine.  Hunger.  Physical exertion.  Fatigue.  Medicines used to treat chest pain (nitroglycerine), birth control pills, estrogen, and some blood pressure medicines. SIGNS AND SYMPTOMS  Pain on one or both sides of your head.  Pulsating or throbbing pain.  Severe pain that prevents daily activities.  Pain that is aggravated by any physical activity.  Nausea, vomiting, or both.  Dizziness.  Pain with exposure to bright lights, loud noises, or activity.  General sensitivity to bright lights, loud noises, or smells. Before you get a migraine, you may get warning signs that a migraine is coming (aura). An aura may include:  Seeing flashing lights.  Seeing bright spots, halos, or zigzag lines.  Having tunnel vision or blurred vision.  Having feelings of numbness or tingling.  Having trouble talking.  Having muscle weakness. DIAGNOSIS  A migraine headache is often diagnosed based on:  Symptoms.  Physical exam.  A CT scan or MRI of your head. These imaging tests cannot diagnose migraines, but they can help rule out other causes of headaches. TREATMENT Medicines may be given for pain and nausea. Medicines can also be given to help prevent recurrent migraines.  HOME CARE INSTRUCTIONS  Only take over-the-counter or prescription medicines for pain or discomfort as directed by your  health care provider. The use of long-term narcotics is not recommended.  Lie down in a dark, quiet room when you have a migraine.  Keep a journal to find out what may trigger your migraine headaches. For example, write down:  What you eat and drink.  How much sleep you get.  Any change to your diet or medicines.  Limit alcohol consumption.  Quit smoking if you smoke.  Get 7-9 hours of sleep, or as recommended by your health care provider.  Limit stress.  Keep lights dim if bright lights bother you and make your migraines worse. SEEK IMMEDIATE MEDICAL CARE IF:   Your migraine becomes severe.  You have a fever.  You have a stiff neck.  You have vision loss.  You have muscular weakness or loss of muscle control.  You start losing your balance or have trouble walking.  You feel faint or pass out.  You have severe symptoms that are different from your first symptoms. MAKE SURE YOU:   Understand these instructions.  Will watch your condition.  Will get help right away if you are not doing well or get worse. Document Released: 09/09/2005 Document Revised: 01/24/2014 Document Reviewed: 05/17/2013 [facility] Patient Information 2015 [place], [place]. This information is not intended to replace advice given to you by your health care provider. Make sure you discuss any questions you have with your health care provider.  Metoclopramide tablets What is this medicine? METOCLOPRAMIDE (met oh kloe PRA mide) is used to treat the symptoms of gastroesophageal reflux disease (GERD) like heartburn. It is also used to treat people with slow emptying of the stomach and  intestinal tract. This medicine may be used for other purposes; ask your health care provider or pharmacist if you have questions. COMMON BRAND NAME(S): Reglan What should I tell my health care provider before I take this medicine? They need to know if you have any of these conditions: -breast  cancer -depression -diabetes -heart failure -high blood pressure -kidney disease -liver disease -Parkinson's disease or a movement disorder -pheochromocytoma -seizures -stomach obstruction, bleeding, or perforation -an unusual or allergic reaction to metoclopramide, procainamide, sulfites, other medicines, foods, dyes, or preservatives -pregnant or trying to get pregnant -breast-feeding How should I use this medicine? Take this medicine by mouth with a glass of water. Follow the directions on the prescription label. Take this medicine on an empty stomach, about 30 minutes before eating. Take your doses at regular intervals. Do not take your medicine more often than directed. Do not stop taking except on the advice of your doctor or health care professional. A special MedGuide will be given to you by the pharmacist with each prescription and refill. Be sure to read this information carefully each time. Talk to your pediatrician regarding the use of this medicine in children. Special care may be needed. Overdosage: If you think you have taken too much of this medicine contact a poison control center or emergency room at once. NOTE: This medicine is only for you. Do not share this medicine with others. What if I miss a dose? If you miss a dose, take it as soon as you can. If it is almost time for your next dose, take only that dose. Do not take double or extra doses. What may interact with this medicine? -acetaminophen -cyclosporine -digoxin -medicines for blood pressure -medicines for diabetes, including insulin -medicines for hay fever and other allergies -medicines for depression, especially an Monoamine Oxidase Inhibitor (MAOI) -medicines for Parkinson's disease, like levodopa -medicines for sleep or for pain -tetracycline This list may not describe all possible interactions. Give your health care provider a list of all the medicines, herbs, non-prescription drugs, or dietary  supplements you use. Also tell them if you smoke, drink alcohol, or use illegal drugs. Some items may interact with your medicine. What should I watch for while using this medicine? It may take a few weeks for your stomach condition to start to get better. However, do not take this medicine for longer than 12 weeks. The longer you take this medicine, and the more you take it, the greater your chances are of developing serious side effects. If you are an elderly patient, a female patient, or you have diabetes, you may be at an increased risk for side effects from this medicine. Contact your doctor immediately if you start having movements you cannot control such as lip smacking, rapid movements of the tongue, involuntary or uncontrollable movements of the eyes, head, arms and legs, or muscle twitches and spasms. Patients and their families should watch out for worsening depression or thoughts of suicide. Also watch out for any sudden or severe changes in feelings such as feeling anxious, agitated, panicky, irritable, hostile, aggressive, impulsive, severely restless, overly excited and hyperactive, or not being able to sleep. If this happens, especially at the beginning of treatment or after a change in dose, call your doctor. Do not treat yourself for high fever. Ask your doctor or health care professional for advice. You may get drowsy or dizzy. Do not drive, use machinery, or do anything that needs mental alertness until you know how this drug affects you.  Do not stand or sit up quickly, especially if you are an older patient. This reduces the risk of dizzy or fainting spells. Alcohol can make you more drowsy and dizzy. Avoid alcoholic drinks. What side effects may I notice from receiving this medicine? Side effects that you should report to your doctor or health care professional as soon as possible: -allergic reactions like skin rash, itching or hives, swelling of the face, lips, or tongue -abnormal  production of milk in females -breast enlargement in both males and females -change in the way you walk -difficulty moving, speaking or swallowing -drooling, lip smacking, or rapid movements of the tongue -excessive sweating -fever -involuntary or uncontrollable movements of the eyes, head, arms and legs -irregular heartbeat or palpitations -muscle twitches and spasms -unusually weak or tired Side effects that usually do not require medical attention (report to your doctor or health care professional if they continue or are bothersome): -change in sex drive or performance -depressed mood -diarrhea -difficulty sleeping -headache -menstrual changes -restless or nervous This list may not describe all possible side effects. Call your doctor for medical advice about side effects. You may report side effects to FDA at 1-800-FDA-1088. Where should I keep my medicine? Keep out of the reach of children. Store at room temperature between 20 and 25 degrees C (68 and 77 degrees F). Protect from light. Keep container tightly closed. Throw away any unused medicine after the expiration date. NOTE: This sheet is a summary. It may not cover all possible information. If you have questions about this medicine, talk to your doctor, pharmacist, or health care provider.  2015, [employer]. (2012-01-07 13:04:38)

## 2014-05-26 NOTE — ED Notes (Signed)
Responded to call light pt was on the phone telling someone that a RN smelled like liquor and that she did not feel right. Told pt that we use alcohol gel on our hands  that smells strong and  She did not need to make false accusations about staff to the public. I asked pt if there was anything else that I could do for her. Pt said no. I advised the charge RN of this.

## 2014-06-02 ENCOUNTER — Emergency Department (HOSPITAL_COMMUNITY): Payer: Self-pay

## 2014-06-02 ENCOUNTER — Encounter (HOSPITAL_COMMUNITY): Payer: Self-pay | Admitting: Emergency Medicine

## 2014-06-02 ENCOUNTER — Emergency Department (HOSPITAL_COMMUNITY)
Admission: EM | Admit: 2014-06-02 | Discharge: 2014-06-02 | Disposition: A | Discharge status: Home / Self Care | Payer: Self-pay | Attending: Emergency Medicine | Admitting: Emergency Medicine

## 2014-06-02 DIAGNOSIS — R569 Unspecified convulsions: Secondary | ICD-10-CM | POA: Insufficient documentation

## 2014-06-02 DIAGNOSIS — F17200 Nicotine dependence, unspecified, uncomplicated: Secondary | ICD-10-CM | POA: Insufficient documentation

## 2014-06-02 DIAGNOSIS — G40909 Epilepsy, unspecified, not intractable, without status epilepticus: Secondary | ICD-10-CM | POA: Insufficient documentation

## 2014-06-02 DIAGNOSIS — J45909 Unspecified asthma, uncomplicated: Secondary | ICD-10-CM | POA: Insufficient documentation

## 2014-06-02 DIAGNOSIS — Z79899 Other long term (current) drug therapy: Secondary | ICD-10-CM | POA: Insufficient documentation

## 2014-06-02 DIAGNOSIS — J0130 Acute sphenoidal sinusitis, unspecified: Secondary | ICD-10-CM | POA: Insufficient documentation

## 2014-06-02 DIAGNOSIS — Z3202 Encounter for pregnancy test, result negative: Secondary | ICD-10-CM | POA: Insufficient documentation

## 2014-06-02 DIAGNOSIS — Z8659 Personal history of other mental and behavioral disorders: Secondary | ICD-10-CM | POA: Insufficient documentation

## 2014-06-02 LAB — CBC
HCT: 33.9 % — ABNORMAL LOW (ref 36.0–46.0)
HEMOGLOBIN: 11.5 g/dL — AB (ref 12.0–15.0)
MCH: 32.4 pg (ref 26.0–34.0)
MCHC: 33.9 g/dL (ref 30.0–36.0)
MCV: 95.5 fL (ref 78.0–100.0)
PLATELETS: 337 10*3/uL (ref 150–400)
RBC: 3.55 MIL/uL — ABNORMAL LOW (ref 3.87–5.11)
RDW: 14.4 % (ref 11.5–15.5)
WBC: 14.2 10*3/uL — ABNORMAL HIGH (ref 4.0–10.5)

## 2014-06-02 LAB — BASIC METABOLIC PANEL
ANION GAP: 15 (ref 5–15)
BUN: 19 mg/dL (ref 6–23)
CALCIUM: 9.1 mg/dL (ref 8.4–10.5)
CHLORIDE: 102 meq/L (ref 96–112)
CO2: 20 mEq/L (ref 19–32)
CREATININE: 0.71 mg/dL (ref 0.50–1.10)
GFR calc non Af Amer: >90 mL/min (ref >90)
Glucose, Bld: 106 mg/dL — ABNORMAL HIGH (ref 70–99)
Potassium: 3.6 mEq/L — ABNORMAL LOW (ref 3.7–5.3)
Sodium: 137 mEq/L (ref 137–147)

## 2014-06-02 LAB — CBG MONITORING, ED: GLUCOSE-CAPILLARY: 94 mg/dL (ref 70–99)

## 2014-06-02 LAB — POC URINE PREG, ED: PREG TEST UR: NEGATIVE

## 2014-06-02 LAB — VALPROIC ACID LEVEL: VALPROIC ACID LVL: 40 ug/mL — AB (ref 50.0–100.0)

## 2014-06-02 MED ORDER — DIVALPROEX SODIUM 500 MG PO DR TAB
500.0000 mg | DELAYED_RELEASE_TABLET | Freq: Once | ORAL | Status: AC
  Administered 2014-06-02: 500 mg via ORAL
  Filled 2014-06-02: qty 1

## 2014-06-02 MED ORDER — AMOXICILLIN 500 MG PO CAPS: 1000.0000 mg | ORAL_CAPSULE | Freq: Two times a day (BID) | ORAL | Status: DC

## 2014-06-02 MED ORDER — ACETAMINOPHEN 500 MG PO TABS
1000.0000 mg | ORAL_TABLET | Freq: Once | ORAL | Status: AC
  Administered 2014-06-02: 1000 mg via ORAL
  Filled 2014-06-02: qty 2

## 2014-06-02 MED ORDER — ACETAMINOPHEN 325 MG PO TABS: 650.0000 mg | ORAL_TABLET | Freq: Once | ORAL | Status: DC

## 2014-06-02 MED ORDER — AMOXICILLIN 500 MG PO CAPS
1000.0000 mg | ORAL_CAPSULE | Freq: Once | ORAL | Status: AC
  Administered 2014-06-02: 1000 mg via ORAL
  Filled 2014-06-02: qty 2

## 2014-06-02 MED ORDER — DIVALPROEX SODIUM 500 MG PO DR TAB: 500.0000 mg | DELAYED_RELEASE_TABLET | Freq: Two times a day (BID) | ORAL | Status: DC

## 2014-06-02 NOTE — ED Notes (Signed)
Pt ambulated to bathroom without difficulty. Mother as stand by Geophysicist/field seismologist.

## 2014-06-02 NOTE — ED Notes (Signed)
Per EMS: Pt from drug rehab place [facility]).  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  Pt states that she started stuttering and feeling that her "face was clenching up" and does not remember anything after.  Pt takes depakote for bipolar, has been halfing her dose because she has been having migraines.  Pt is not postictal but anxious.  Pt has been clean from cocaine x 3 months.  Denies pain.  Was not injured during the incident.

## 2014-06-02 NOTE — ED Notes (Signed)
Patient transported to CT 

## 2014-06-02 NOTE — ED Provider Notes (Signed)
CSN: [id number]     Arrival date & time 06/02/14  1542 History   First MD Initiated Contact with Patient 06/02/14 1839     Chief Complaint  Patient presents with  . Seizures     (Consider location/radiation/quality/duration/timing/severity/associated sxs/prior Treatment) HPI The patient was apparently sitting out talking to a coworker or friend having a smoke break. Reportedly her friend saw her have a seizure. It is reportedly 30 seconds to a minute in duration. Report was for shaking and clenching of. Patient does take Depakote for bipolar disorder but reportedly does not have a formal seizure disorder diagnosis. The patient reports the quality symptoms were feeling very shaky and clenching. She reports that she was stuttering and couldn't say things she wanted to. There apparently was no injury during the episode no fall to the ground. The patient does describe herself as having been "pulled forward" but not getting any injury. Subsequent to that the patient apparently did have 3 more brief episodes of seizure-like activity. These were all brief approximately 30 seconds duration. No apparent postictal symptoms were present. The patient does have a history of drug abuse but denies any recent drug use. The patient also has history of migraine headaches. Reglan was prescribed at her last emergency room visit however the patient denies that she ever filled it.  Past Medical History  Diagnosis Date  . Asthma   . Bipolar 1 disorder    No past surgical history on file. No family history on file. History  Substance Use Topics  . Smoking status: Current Every Day Smoker  . Smokeless tobacco: Not on file  . Alcohol Use: No   OB History   Grav Para Term Preterm Abortions TAB SAB Ect Mult Living                 Review of Systems  10 Systems reviewed and are negative for acute change except as noted in the HPI.   Allergies  Review of patient's allergies indicates no known  allergies.  Home Medications   Prior to Admission medications   Medication Sig Start Date End Date Taking? Authorizing Provider  divalproex (DEPAKOTE) 500 MG DR tablet Take 500 mg by mouth 2 (two) times daily.    Yes Historical Provider, MD  amoxicillin (AMOXIL) 500 MG capsule Take 2 capsules (1,000 mg total) by mouth 2 (two) times daily. 06/02/14   Arby Barrette, MD  divalproex (DEPAKOTE) 500 MG DR tablet Take 1 tablet (500 mg total) by mouth 2 (two) times daily. 06/02/14   Arby Barrette, MD  metoCLOPramide (REGLAN) 10 MG tablet Take 1 tablet (10 mg total) by mouth every 6 (six) hours as needed for nausea (or headache). 05/26/14   Dione Booze, MD   BP 129/70  Pulse 72  Temp(Src) 98.3 F (36.8 C) (Oral)  Resp 18  SpO2 100%  LMP 05/11/2014 Physical Exam  Nursing note and vitals reviewed. Constitutional: She is oriented to person, place, and time.  Awake, alert, nontoxic appearance .  HENT:  Head: Normocephalic and atraumatic.  Nose: Nose normal.  Mouth/Throat: Oropharynx is clear and moist. No oropharyngeal exudate.  Eyes: Conjunctivae and EOM are normal. Pupils are equal, round, and reactive to light. Right eye exhibits no discharge. Left eye exhibits no discharge.  Neck: Neck supple.  Cardiovascular: Normal rate and regular rhythm.   No murmur heard. Pulmonary/Chest: Effort normal and breath sounds normal. No stridor. No respiratory distress. She has no wheezes. She has no rales. She exhibits no tenderness.  Abdominal: Soft. Bowel sounds are normal. She exhibits no mass. There is no tenderness. There is no rebound.  Musculoskeletal: Normal range of motion. She exhibits no edema and no tenderness.  Baseline ROM, moves extremities with no obvious new focal weakness.  Lymphadenopathy:    She has no cervical adenopathy.  Neurological: She is alert and oriented to person, place, and time. No cranial nerve deficit. She exhibits normal muscle tone. Coordination normal.  Awake, alert,  cooperative and aware of situation; motor strength bilaterally; sensation normal to light touch bilaterally; peripheral visual fields full to confrontation; no facial asymmetry; tongue midline; major cranial nerves appear intact; no pronator drift, normal finger to nose bilaterally, baseline gait without new ataxia. Patient has an anxious appearance to her, and as she is talking she does some stuttering and reports that she can't help doing that. However the content of speech is normal and is not slurred.  Skin: Skin is warm and dry. No rash noted.  Psychiatric:  Anxious.     ED Course  Procedures (including critical care time) Labs Review Labs Reviewed  VALPROIC ACID LEVEL - Abnormal; Notable for the following:    Valproic Acid Lvl 40.0 (*)    All other components within normal limits  BASIC METABOLIC PANEL - Abnormal; Notable for the following:    Potassium 3.6 (*)    Glucose, Bld 106 (*)    All other components within normal limits  CBC - Abnormal; Notable for the following:    WBC 14.2 (*)    RBC 3.55 (*)    Hemoglobin 11.5 (*)    HCT 33.9 (*)    All other components within normal limits  CBG MONITORING, ED  POC URINE PREG, ED    Imaging Review Ct Head Wo Contrast  06/02/2014   CLINICAL DATA:  Seizure  EXAM: CT HEAD WITHOUT CONTRAST  TECHNIQUE: Contiguous axial images were obtained from the base of the skull through the vertex without intravenous contrast.  COMPARISON:  None.  FINDINGS: There is no acute intracranial hemorrhage or infarct. No mass lesion or midline shift. Gray-white matter differentiation is well maintained. Ventricles are normal in size without evidence of hydrocephalus. CSF containing spaces are within normal limits. No extra-axial fluid collection.  The calvarium is intact.  Orbital soft tissues are within normal limits.  Air-fluid level partially visualized within the left maxillary sinus. Layering opacity also present within the left sphenoid sinus. Ethmoidal  air cells are partially opacified. No mastoid effusion.  Scalp soft tissues are unremarkable.  IMPRESSION: 1. No acute intracranial process. 2. Air-fluid levels within the left maxillary and left sphenoid sinus with partial opacification of the ethmoidal air cells, suggesting acute sinusitis.   Electronically Signed   By: Rise Mu M.D.   On: 06/02/2014 19:19     EKG Interpretation None      MDM   Final diagnoses:  Seizure  Acute sphenoidal sinusitis, recurrence not specified   At this point time consideration is given for possible seizure disorder versus pseudoseizure versus anxiety panic attack. The patient's neurologic examination is intact and normal. CT head does show probable acute sinusitis. The patient has been having increasing headaches recently and this may be due to sinus infection. She does however also have a migraine history. At this juncture I will choose to treat her for sinusitis given a more recent headache history other consideration is also for complex migraine. I do feel that the patient is safe for discharge with plan continued outpatient evaluation.  I will have the patient go back to her regular twice a day Depakote dose which she had recently cut back to half. Instructions are given to return to the emergency department if unusual or changing symptoms develop.  Arby Barrette, MD 06/02/14 2240

## 2014-06-02 NOTE — ED Notes (Signed)
Pt returned from CT °

## 2014-06-02 NOTE — ED Notes (Signed)
In addition to previous note, pt now recalls having 3 episodes of severe left sided weakness yesterday, pt states she was unable to move her left upper or lower extremities. Pt states that she began having difficulty stuttering today. Pt states that for the past three weeks she has been working in a damp basement that may have mold problems. On neuro exam, left and right side remain symmetrical in movement and strength.

## 2014-06-02 NOTE — Discharge Instructions (Signed)
°Emergency Department Resource Guide °1) Find a Doctor and Pay Out of Pocket °Although you won't have to find out who is covered by your insurance plan, it is a good idea to ask around and get recommendations. You will then need to call the office and see if the doctor you have chosen will accept you as a new patient and what types of options they offer for patients who are self-pay. Some doctors offer discounts or will set up payment plans for their patients who do not have insurance, but you will need to ask so you aren't surprised when you get to your appointment. ° °2) Contact Your Local Health Department °Not all health departments have doctors that can see patients for sick visits, but many do, so it is worth a call to see if yours does. If you don't know where your local health department is, you can check in your phone book. The CDC also has a tool to help you locate your state's health department, and many state websites also have listings of all of their local health departments. ° °3) Find a Walk-in Clinic °If your illness is not likely to be very severe or complicated, you may want to try a walk in clinic. These are popping up all over the country in pharmacies, drugstores, and shopping centers. They're usually staffed by nurse practitioners or physician assistants that have been trained to treat common illnesses and complaints. They're usually fairly quick and inexpensive. However, if you have serious medical issues or chronic medical problems, these are probably not your best option. ° °No Primary Care Doctor: °- Call [facility] at  832-8000 - they can help you locate a primary care doctor that  accepts your insurance, provides certain services, etc. °- Physician Referral Service- 1-800-533-3463 ° °Chronic Pain Problems: °Organization         Address  Phone   Notes  °[facility]  (336) 297-2271 Patients need to be referred by their primary care doctor.  ° °Medication  Assistance: °Organization         Address  Phone   Notes  °[place] [facility] [address] °[place], [place] [address] (336) 641-8030 --Must be a resident of [place] °-- Must have NO insurance coverage whatsoever (no Medicaid/ Medicare, etc.) °-- The pt. MUST have a primary care doctor that directs their care regularly and follows them in the community °  °[facility]  (866) 331-1348   °[employer]  (888) 892-1162   ° °Agencies that provide inexpensive medical care: °Organization         Address  Phone   Notes  °[facility]  (336) 832-8035   °[facility]    (336) 832-7272   °[facility] [address] °[place], [place] [address] (336) 832-4777   °[facility] [address], °[place] (336) 271-4999   °[facility]    (336) 373-0678   °[facility]    (336) 272-1050   °[facility] ° [address], [place] Phone:  (336) 832-4444, Fax:  (336) 832-4440 Hours of Operation:  9 am - 6 pm, M-F.  Also accepts Medicaid/Medicare and self-pay.  °[facility] ° [address], [place] Phone: (336) 832-3150, Fax: (336) 832-3151. Hours of Operation:  8:30 am - 5:30 pm, M-F.  Also accepts Medicaid and self-pay.  °[facility] [place] [address], [place] Phone: (336) 878-6027   °[facility] [address], [place], [place] (336)723-1848, Ext. 123 Mondays & Thursdays: 7-9 AM.  First 15 patients are seen on a first come, first serve basis. °  ° °Medicaid-accepting [place] Providers: ° °Organization         Address  Phone   Notes  °[facility] [address], [place] (336) 641-2100 Also accepts self-pay patients.  °[facility] [address], [place] ° (336) 856-9996   °[facility] [address], [place]  (336) 288-8857   °[facility] [address] [place] [address], [place] (336) 299-7000   °Veita Bland [address], [place]  ° (336) 373-1557 Only accepts [place] Access Medicaid patients after they have their name applied to their card.  ° °Self-Pay (no insurance) in [place]: ° °Organization         Address  Phone   Notes  °Sickle Cell Patients, [facility] [address], [place] (336) 832-1970   °[facility] [address], [place] (336) 832-4400   °[facility] ° [address], [place] (336) 992-4800   °[facility]/Dr. Osei-Bonsu ° [address] [place] [address], [place] or [address], [place] (336) 841-8500 Phone number for both [place] and [place] locations is the same.  °[facility] [address], [place] (336) 299-0000   °[facility] [address] [place] [address], [place] or [address] (336) 852-7530 °(336) 878-2260   °[facility] [address], [place] (336) 350-1642, phone; (336) 294-5005, fax Sees patients 1st and 3rd Saturday of every month.  Must not qualify for public or private insurance (i.e. Medicaid, Medicare, [place] Health Choice, Veterans' Benefits) • Household income should be no more than 200% of the poverty level •The clinic cannot treat you if you are pregnant or think you are pregnant • Sexually transmitted diseases are not treated at the clinic.  ° ° °Dental Care: °Organization         Address  Phone  Notes  °[place] [facility] [address], [place] (336) 641-6152 Accepts children up to age 21 who are enrolled in Medicaid or [place] Health Choice; pregnant women with a Medicaid card; and children who have applied for Medicaid or [place] Health Choice, but were declined, whose parents can pay a reduced fee at time of service.  °[place]  [facility] [place]  [address], [place] (336) 641-7733 Accepts children up to age 21 who are enrolled in Medicaid or [place] Health Choice; pregnant women with a Medicaid card; and children who have applied for Medicaid or [place] Health Choice, but were declined, whose parents can pay a reduced fee at time of service.  °[facility] ° [address], [place] (336) 641-4533 Patients are seen by appointment only. Walk-ins are not accepted. [facility] will see patients 18 years of age and older. °Monday - Tuesday (8am-5pm) °Most Wednesdays (8:30-5pm) °$30 per visit, cash only  °[facility] ° [address], [place] (336) 641-4533 Patients are seen by appointment only. Walk-ins are not accepted. [facility] will see patients 18 years of age and older. °One   Wednesday Evening (Monthly: Volunteer Based).  $30 per visit, cash only  °[facility]  (919) [phone] for adults; Children under age 4, call Graduate Pediatric Dentistry at ([phone]) [phone]. Children aged 4-14, please call ([phone]) [phone] to request a pediatric application. ° Dental services are provided in all areas of dental care including fillings, crowns and bridges, complete and partial dentures, implants, gum treatment, root canals, and extractions. Preventive care is also provided. Treatment is provided to both adults and children. °Patients are selected via a lottery and there is often a waiting list. °  °[facility] [address], °[place] ° (336) 763-8833 [email or web address] °  °[facility] [address], [place], [place] (336)723-1848, Ext. 123 Second and Fourth Thursday of each month, opens at 6:30 AM; Clinic ends at 9 AM.  Patients are seen on a first-come first-served basis, and a limited number are seen during each clinic.  ° °[facility] ° [address], [place], [place] (336) 723-7904    Eligibility Requirements °You must have lived in [place], [place], or [place] counties for at least the last three months. °  You cannot be eligible for state or federal sponsored healthcare insurance, including Veterans Administration, Medicaid, or Medicare. °  You generally cannot be eligible for healthcare insurance through your employer.  °  How to apply: °Eligibility screenings are held every Tuesday and Wednesday afternoon from 1:00 pm until 4:00 pm. You do not need an appointment for the interview!  °[facility] [address], [place], [place] 336-631-2330   °[facility]  336-342-8273   °[facility]  336-703-3100   °[facility]  336-570-6415   ° °Behavioral Health Resources in the Community: °Intensive Outpatient Programs °Organization         Address  Phone  Notes  °[facility] [address], [place], [place] 336-878-6098   °[facility] [address], [place], [place] 336-832-9800   °[facility] [address], [place], [place] ° 336-882-2125   °[facility] [address],  °[place], [place] 1-800-853-5163 or 336-641-4981   °Substance Abuse Resources °Organization         Address  Phone  Notes  °[facility]  336-882-2125   °[facility]  336-784-9470   °[facility]  336-285-9073   °[facility]  336-845-3988   °Residential & Outpatient Substance Abuse Program  1-800-659-3381   °Psychological Services °Organization         Address  Phone  Notes  °[facility]  336- 832-9600   °[facility]  336- 378-7881   °[facility] [address], [place] 1-800-853-5163 or 336-641-4981   ° °Mobile Crisis Teams °Organization         Address  Phone  Notes  °[facility], Mobile Crisis Care Unit  1-877-626-1772   °Assertive °[facility] ° [address]  [place], [place] 336-834-9664   °Sharon DeEsch [address] °[place] 336-554-5454   ° °Self-Help/Support Groups °Organization         Address  Phone             Notes  °[facility]  - variety of support groups  336- 373-1402 Call for more information  °Narcotics Anonymous (NA), [facility] [address], °[place]  2 meetings at this location  ° °  Residential Treatment Programs Organization         Address  Phone  Notes  [facility] [address]    [place]  1-610-960-4540   [facility]  [address] [place] [address], [place], [place] [phone]   [facility] [address] [place], [place] 086-578-4696 Admissions: 8am-3pm M-F  [facility] [address]    [place], [place] 295-284-1324   [facility] [address] [place], [place], [place] 401-027-2536   [facility] [address],  [place], [place] 644-034-7425   [facility] Intensive Outpatient [address], [place], [place] 956-387-5643   [facility] ([facility] [address] [place]  [place], [place] 3-295-188-4166 or 919-116-5219   [facility] [address] [place], [place] 323-557-3220 Accepts Medicaid  [facility] [address]  [place] 2-542-706-2376 Substance Abuse/Addiction Treatment   [facility]         Address  Phone  Notes  [facility]  309-740-4232   Angie Fava, PhD [address] [place] [address], [place]   218-228-1218 or 559-459-3388   [facility]   [address] [place], [place] 239-254-5304   [facility] [address] [place], [place] [phone] Insurance/Medicaid/sponsorship through [facility] and Families [address]                                    [place], [place] 636-344-4028 Therapy/tele-psych/case    [facility] [address], [place] [phone]    Dr. Lolly Mustache  947-741-5446   [facility]  [employer] [facility] Dept. 1) [address] [place] 2) [address], [place] 3)  [address], [place] [phone] (902)[phone]  516-652-3761   [facility] Child Abuse Hotline 239-506-6318 or 680-056-5248 (After Hours)     Seizure, Adult A seizure is abnormal electrical activity in the brain. Seizures usually last from 30 seconds to 2 minutes. There are various types of seizures. Before a seizure, you may have a warning sensation (aura) that a seizure is about to occur. An aura may include the following symptoms:   Fear or anxiety.  Nausea.  Feeling like the room is spinning (vertigo).  Vision changes, such as seeing flashing lights or spots. Common symptoms during a seizure include:  A change in attention or behavior (altered mental status).  Convulsions with rhythmic jerking movements.  Drooling.  Rapid eye movements.  Grunting.  Loss of bladder and bowel control.  Bitter taste in the mouth.  Tongue biting. After a seizure, you may feel confused and sleepy. You may also have an injury resulting from convulsions during the seizure. HOME CARE INSTRUCTIONS   If you are given medicines, take them exactly as prescribed by your health care provider.  Keep all follow-up appointments as directed by your health care provider.  Do not swim or drive or engage in risky activity during which a seizure could cause further injury to you or others until your health care provider says it is OK.  Get adequate rest.  Teach friends and family what to do if you have a seizure. They should:  Lay you on the ground to prevent  a fall.  Put a cushion under your head.  Loosen any tight clothing around your neck.  Turn you on your side. If vomiting occurs, this helps keep your airway clear.  Stay with you until  you recover.  Know whether or not you need emergency care. SEEK IMMEDIATE MEDICAL CARE IF:  The seizure lasts longer than 5 minutes.  The seizure is severe or you do not wake up immediately after the seizure.  You have an altered mental status after the seizure.  You are having more frequent or worsening seizures. Someone should drive you to the emergency department or call local emergency services (911 in [place] MAKE SURE YOU:  Understand these instructions.  Will watch your condition.  Will get help right away if you are not doing well or get worse. Document Released: 09/06/2000 Document Revised: 06/30/2013 Document Reviewed: 04/21/2013 [facility] Patient Information 2015 [place], [place]. This information is not intended to replace advice given to you by your health care provider. Make sure you discuss any questions you have with your health care provider. Sinusitis Sinusitis is redness, soreness, and inflammation of the paranasal sinuses. Paranasal sinuses are air pockets within the bones of your face (beneath the eyes, the middle of the forehead, or above the eyes). In healthy paranasal sinuses, mucus is able to drain out, and air is able to circulate through them by way of your nose. However, when your paranasal sinuses are inflamed, mucus and air can become trapped. This can allow bacteria and other germs to grow and cause infection. Sinusitis can develop quickly and last only a short time (acute) or continue over a long period (chronic). Sinusitis that lasts for more than 12 weeks is considered chronic.  CAUSES  Causes of sinusitis include:  Allergies.  Structural abnormalities, such as displacement of the cartilage that separates your nostrils (deviated septum), which can decrease the air flow through your nose and sinuses and affect sinus drainage.  Functional abnormalities, such as when the small hairs (cilia) that line your sinuses and help remove mucus do not work properly  or are not present. SIGNS AND SYMPTOMS  Symptoms of acute and chronic sinusitis are the same. The primary symptoms are pain and pressure around the affected sinuses. Other symptoms include:  Upper toothache.  Earache.  Headache.  Bad breath.  Decreased sense of smell and taste.  A cough, which worsens when you are lying flat.  Fatigue.  Fever.  Thick drainage from your nose, which often is green and may contain pus (purulent).  Swelling and warmth over the affected sinuses. DIAGNOSIS  Your health care provider will perform a physical exam. During the exam, your health care provider may:  Look in your nose for signs of abnormal growths in your nostrils (nasal polyps).  Tap over the affected sinus to check for signs of infection.  View the inside of your sinuses (endoscopy) using an imaging device that has a light attached (endoscope). If your health care provider suspects that you have chronic sinusitis, one or more of the following tests may be recommended:  Allergy tests.  Nasal culture. A sample of mucus is taken from your nose, sent to a lab, and screened for bacteria.  Nasal cytology. A sample of mucus is taken from your nose and examined by your health care provider to determine if your sinusitis is related to an allergy. TREATMENT  Most cases of acute sinusitis are related to a viral infection and will resolve on their own within 10 days.  Sometimes medicines are prescribed to help relieve symptoms (pain medicine, decongestants, nasal steroid sprays, or saline sprays).  However, for sinusitis related to a bacterial infection, your health care provider will prescribe antibiotic medicines. These are medicines that will help kill the bacteria causing the infection.  Rarely, sinusitis is caused by a fungal infection. In theses cases, your health care provider will prescribe antifungal medicine. For some cases of chronic sinusitis, surgery is needed. Generally, these are  cases in which sinusitis recurs more than 3 times per year, despite other treatments. HOME CARE INSTRUCTIONS   Drink plenty of water. Water helps thin the mucus so your sinuses can drain more easily.  Use a humidifier.  Inhale steam 3 to 4 times a day (for example, sit in the bathroom with the shower running).  Apply a warm, moist washcloth to your face 3 to 4 times a day, or as directed by your health care provider.  Use saline nasal sprays to help moisten and clean your sinuses.  Take medicines only as directed by your health care provider.  If you were prescribed either an antibiotic or antifungal medicine, finish it all even if you start to feel better. SEEK IMMEDIATE MEDICAL CARE IF:  You have increasing pain or severe headaches.  You have nausea, vomiting, or drowsiness.  You have swelling around your face.  You have vision problems.  You have a stiff neck.  You have difficulty breathing. MAKE SURE YOU:   Understand these instructions.  Will watch your condition.  Will get help right away if you are not doing well or get worse. Document Released: 09/09/2005 Document Revised: 01/24/2014 Document Reviewed: 09/24/2011 [facility] Patient Information 2015 [place], [place]. This information is not intended to replace advice given to you by your health care provider. Make sure you discuss any questions you have with your health care provider.

## 2014-07-07 ENCOUNTER — Ambulatory Visit: Payer: Self-pay | Admitting: Orthopedic Surgery

## 2014-07-14 ENCOUNTER — Ambulatory Visit: Payer: Self-pay | Admitting: Orthopedic Surgery

## 2014-07-14 LAB — DRUG SCREEN, URINE

## 2014-07-28 DIAGNOSIS — F319 Bipolar disorder, unspecified: Secondary | ICD-10-CM | POA: Insufficient documentation

## 2014-08-15 ENCOUNTER — Emergency Department (HOSPITAL_COMMUNITY)
Admission: EM | Admit: 2014-08-15 | Discharge: 2014-08-15 | Disposition: A | Discharge status: Home / Self Care | Payer: Self-pay | Attending: Emergency Medicine | Admitting: Emergency Medicine

## 2014-08-15 ENCOUNTER — Encounter (HOSPITAL_COMMUNITY): Payer: Self-pay | Admitting: Family Medicine

## 2014-08-15 DIAGNOSIS — G518 Other disorders of facial nerve: Secondary | ICD-10-CM

## 2014-08-15 DIAGNOSIS — Z8669 Personal history of other diseases of the nervous system and sense organs: Secondary | ICD-10-CM | POA: Insufficient documentation

## 2014-08-15 DIAGNOSIS — Z720 Tobacco use: Secondary | ICD-10-CM | POA: Insufficient documentation

## 2014-08-15 DIAGNOSIS — J45909 Unspecified asthma, uncomplicated: Secondary | ICD-10-CM | POA: Insufficient documentation

## 2014-08-15 DIAGNOSIS — R202 Paresthesia of skin: Secondary | ICD-10-CM | POA: Insufficient documentation

## 2014-08-15 DIAGNOSIS — Z79899 Other long term (current) drug therapy: Secondary | ICD-10-CM | POA: Insufficient documentation

## 2014-08-15 DIAGNOSIS — R51 Headache: Secondary | ICD-10-CM | POA: Insufficient documentation

## 2014-08-15 DIAGNOSIS — F319 Bipolar disorder, unspecified: Secondary | ICD-10-CM

## 2014-08-15 DIAGNOSIS — R569 Unspecified convulsions: Secondary | ICD-10-CM | POA: Insufficient documentation

## 2014-08-15 LAB — COMPREHENSIVE METABOLIC PANEL
ALBUMIN: 3.3 g/dL — AB (ref 3.5–5.2)
ALK PHOS: 49 U/L (ref 39–117)
ALT: 15 U/L (ref 0–35)
ANION GAP: 11 (ref 5–15)
AST: 17 U/L (ref 0–37)
BUN: 21 mg/dL (ref 6–23)
CHLORIDE: 102 meq/L (ref 96–112)
CO2: 25 mEq/L (ref 19–32)
CREATININE: 0.73 mg/dL (ref 0.50–1.10)
Calcium: 9.2 mg/dL (ref 8.4–10.5)
GFR calc non Af Amer: >90 mL/min (ref >90)
Glucose, Bld: 85 mg/dL (ref 70–99)
Potassium: 3.9 mEq/L (ref 3.7–5.3)
Sodium: 138 mEq/L (ref 137–147)
Total Protein: 7 g/dL (ref 6.0–8.3)

## 2014-08-15 LAB — CBC WITH DIFFERENTIAL/PLATELET
BASOS PCT: 0 % (ref 0–1)
Basophils Absolute: 0 10*3/uL (ref 0.0–0.1)
EOS ABS: 0.2 10*3/uL (ref 0.0–0.7)
Eosinophils Relative: 3 % (ref 0–5)
HEMATOCRIT: 35.7 % — AB (ref 36.0–46.0)
Hemoglobin: 12.2 g/dL (ref 12.0–15.0)
Lymphocytes Relative: 47 % — ABNORMAL HIGH (ref 12–46)
Lymphs Abs: 3.7 10*3/uL (ref 0.7–4.0)
MCH: 33.9 pg (ref 26.0–34.0)
MCHC: 34.2 g/dL (ref 30.0–36.0)
MCV: 99.2 fL (ref 78.0–100.0)
MONO ABS: 0.5 10*3/uL (ref 0.1–1.0)
Monocytes Relative: 6 % (ref 3–12)
NEUTROS ABS: 3.4 10*3/uL (ref 1.7–7.7)
Neutrophils Relative %: 44 % (ref 43–77)
Platelets: 246 10*3/uL (ref 150–400)
RBC: 3.6 MIL/uL — AB (ref 3.87–5.11)
RDW: 12.5 % (ref 11.5–15.5)
WBC: 7.7 10*3/uL (ref 4.0–10.5)

## 2014-08-15 LAB — VALPROIC ACID LEVEL: Valproic Acid Lvl: 49.2 ug/mL — ABNORMAL LOW (ref 50.0–100.0)

## 2014-08-15 MED ORDER — DIVALPROEX SODIUM 500 MG PO DR TAB: 500.0000 mg | DELAYED_RELEASE_TABLET | Freq: Two times a day (BID) | ORAL | Status: DC

## 2014-08-15 MED ORDER — GABAPENTIN 100 MG PO CAPS
100.0000 mg | ORAL_CAPSULE | Freq: Once | ORAL | Status: AC
  Administered 2014-08-15: 100 mg via ORAL
  Filled 2014-08-15: qty 1

## 2014-08-15 MED ORDER — GABAPENTIN 100 MG PO CAPS: 100.0000 mg | ORAL_CAPSULE | Freq: Three times a day (TID) | ORAL | Status: DC

## 2014-08-15 NOTE — ED Notes (Signed)
Per pt sts she has been having episodes of sharp pain in the right side of her head. sts also pins and needles in head. sts increasing in frequency.

## 2014-08-15 NOTE — ED Notes (Signed)
Pt c/o R sided frontal headache, described as pins and needles. Yesterday pt had an episode of L leg numbness and unable to move leg without assistance. Pt was seen in September for new onset seizures. Pt reports "my depakene levels were low. I've been taking Depakote for mood swings." Pt A&O x 4 and ambulatory without assistance.

## 2014-08-15 NOTE — ED Provider Notes (Signed)
CSN: [id number]     Arrival date & time 08/15/14  1814 History   First MD Initiated Contact with Patient 08/15/14 1934     Chief Complaint  Patient presents with  . Numbness  . Tingling  . Headache   HPI  Patient is a 28 year old female who presents to the emergency room for right-sided facial pain. The patient she has been experiencing intermittent electric shooting pain on the right side of her face for the past month. Patient states that these episodes last for 30 seconds to a minute. They have been increasing in frequency and severity. She states that there does not seem to be any aggravating or relieving factors at this time. Sometimes during these episodes she experiences some right sided spotting in her vision. Patient states that she is afraid that she is having mini strokes or seizures. Patient states that she recently had her first seizure in September of this year. [facility] was [facility] at [facility] and had a normal head CT at that time. Patient is unsure as to why she had a seizure. She was referred to neurology at that time, but she states that she has not no money and cannot afford to go to a neurologist. Patient denies any previous history of this. Chart review also reveals severe history of migraines.  Past Medical History  Diagnosis Date  . Asthma   . Bipolar 1 disorder    History reviewed. No pertinent past surgical history. History reviewed. No pertinent family history. History  Substance Use Topics  . Smoking status: Current Every Day Smoker  . Smokeless tobacco: Not on file  . Alcohol Use: No   OB History    No data available     Review of Systems  Constitutional: Negative for fever, chills and fatigue.  Eyes: Positive for visual disturbance. Negative for photophobia, pain, discharge, redness and itching.  Respiratory: Negative for chest tightness and shortness of breath.   Cardiovascular: Negative for chest pain and palpitations.  Gastrointestinal: Negative for  nausea, vomiting, abdominal pain, diarrhea and constipation.  Neurological: Positive for seizures. Negative for dizziness, tremors, syncope, facial asymmetry, weakness, light-headedness, numbness and headaches.  All other systems reviewed and are negative.     Allergies  Review of patient's allergies indicates no known allergies.  Home Medications   Prior to Admission medications   Medication Sig Start Date End Date Taking? Authorizing Provider  Acetaminophen-Caff-Pyrilamine (MIDOL COMPLETE) 500-60-15 MG TABS Take 1 tablet by mouth 2 (two) times daily as needed.   Yes Historical Provider, MD  divalproex (DEPAKOTE) 500 MG DR tablet Take 1 tablet (500 mg total) by mouth 2 (two) times daily. 08/15/14   Annitta Fifield A Forcucci, PA-C  gabapentin (NEURONTIN) 100 MG capsule Take 1 capsule (100 mg total) by mouth 3 (three) times daily. 08/15/14   Reeder Brisby A Forcucci, PA-C   BP 103/70 mmHg  Pulse 66  Temp(Src) 97.9 F (36.6 C) (Oral)  Resp 13  SpO2 99%  LMP 08/15/2014 Physical Exam  Constitutional: She is oriented to person, place, and time. She appears well-developed and well-nourished. No distress.  HENT:  Head: Normocephalic and atraumatic.  Mouth/Throat: Oropharynx is clear and moist. No oropharyngeal exudate.  Eyes: Conjunctivae and EOM are normal. Pupils are equal, round, and reactive to light. No scleral icterus.  Neck: Normal range of motion. Neck supple. No JVD present. No thyromegaly present.  Cardiovascular: Normal rate, regular rhythm, normal heart sounds and intact distal pulses.  Exam reveals no gallop and no friction  rub.   No murmur heard. Pulmonary/Chest: Effort normal and breath sounds normal. No respiratory distress. She has no wheezes. She has no rales. She exhibits no tenderness.  Abdominal: Soft. Bowel sounds are normal. She exhibits no distension and no mass. There is no tenderness. There is no rebound and no guarding.  Musculoskeletal: Normal range of motion.   Lymphadenopathy:    She has no cervical adenopathy.  Neurological: She is alert and oriented to person, place, and time. She has normal strength. No cranial nerve deficit or sensory deficit. Coordination normal.  Normal rapid alternating movements, negative pronator drift,  Skin: Skin is warm and dry. She is not diaphoretic.  Psychiatric: She has a normal mood and affect. Her behavior is normal. Judgment and thought content normal.  Nursing note and vitals reviewed.   ED Course  Procedures (including critical care time) Labs Review Labs Reviewed  CBC WITH DIFFERENTIAL - Abnormal; Notable for the following:    RBC 3.60 (*)    HCT 35.7 (*)    Lymphocytes Relative 47 (*)    All other components within normal limits  COMPREHENSIVE METABOLIC PANEL - Abnormal; Notable for the following:    Albumin 3.3 (*)    Total Bilirubin <0.2 (*)    All other components within normal limits  RPR  HIV ANTIBODY (ROUTINE TESTING)  VALPROIC ACID LEVEL    Imaging Review No results found.   EKG Interpretation None      MDM   Final diagnoses:  Neuralgic facial pain  Bipolar 1 disorder   Patient is a 28 year old female who presents to the emergency room for right-sided facial pain. Physical exam reveals no focal neurological deficits, and patient is alert and nontoxic appearing at this time. Patient has a negative head CT from 06/02/2014. CBC, CMP, RPR, and HIV antibodies were drawn here. Differential includes trigeminal neuralgia, Lyme disease, syphilis, shingles. She has a normal neurological examination at this time, I do not feel that she needs an emergent MRI. Patient likely needs neurology follow-up. We will give her referral to neurology. I will also provide the resource list at this time. Doubt acute stroke, TIA. Will try patient on gabapentin to see if there is symptom relief. Patient has asked that I add on a Depakote level. She is also asking that I prescribe a short course of Depakote IR,  as her psychiatrist refuses to prescribe the immediate release medication. I have added on a Depakote level at this time. Patient to be given gabapentin here. Patient to return for worsening pain, or any concerning neurological symptoms we discussed. Patient states understanding and agreement at this time. Patient has been discussed with Dr. Jeraldine LootsLockwood who agrees with the above workup and plan. RPR and HIV are currently pending at this time.  Eben Burowourtney A Forcucci, PA-C 08/15/14 2153  Gerhard Munchobert Lockwood, MD 08/16/14 [phone]

## 2014-08-15 NOTE — ED Notes (Signed)
Pt monitored by pulse ox, bp cuff, and 5-lead. 

## 2014-08-15 NOTE — Discharge Instructions (Signed)
Neuropathic Pain We often think that pain has a physical cause. If we get rid of the cause, the pain should go away. Nerves themselves can also cause pain. It is called neuropathic pain, which means nerve abnormality. It may be difficult for the patients who have it and for the treating caregivers. Pain is usually described as acute (short-lived) or chronic (long-lasting). Acute pain is related to the physical sensations caused by an injury. It can last from a few seconds to many weeks, but it usually goes away when normal healing occurs. Chronic pain lasts beyond the typical healing time. With neuropathic pain, the nerve fibers themselves may be damaged or injured. They then send incorrect signals to other pain centers. The pain you feel is real, but the cause is not easy to find.  CAUSES  Chronic pain can result from diseases, such as diabetes and shingles (an infection related to chickenpox), or from trauma, surgery, or amputation. It can also happen without any known injury or disease. The nerves are sending pain messages, even though there is no identifiable cause for such messages.   Other common causes of neuropathy include diabetes, phantom limb pain, or Regional Pain Syndrome (RPS).  As with all forms of chronic back pain, if neuropathy is not correctly treated, there can be a number of associated problems that lead to a downward cycle for the patient. These include depression, sleeplessness, feelings of fear and anxiety, limited social interaction and inability to do normal daily activities or work.  The most dramatic and mysterious example of neuropathic pain is called "phantom limb syndrome." This occurs when an arm or a leg has been removed because of illness or injury. The brain still gets pain messages from the nerves that originally carried impulses from the missing limb. These nerves now seem to misfire and cause troubling pain.  Neuropathic pain often seems to have no cause. It responds  poorly to standard pain treatment. Neuropathic pain can occur after:  Shingles (herpes zoster virus infection).  A lasting burning sensation of the skin, caused usually by injury to a peripheral nerve.  Peripheral neuropathy which is widespread nerve damage, often caused by diabetes or alcoholism.  Phantom limb pain following an amputation.  Facial nerve problems (trigeminal neuralgia).  Multiple sclerosis.  Reflex sympathetic dystrophy.  Pain which comes with cancer and cancer chemotherapy.  Entrapment neuropathy such as when pressure is put on a nerve such as in carpal tunnel syndrome.  Back, leg, and hip problems (sciatica).  Spine or back surgery.  HIV Infection or AIDS where nerves are infected by viruses. Your caregiver can explain items in the above list which may apply to you. SYMPTOMS  Characteristics of neuropathic pain are:  Severe, sharp, electric shock-like, shooting, lightening-like, knife-like.  Pins and needles sensation.  Deep burning, deep cold, or deep ache.  Persistent numbness, tingling, or weakness.  Pain resulting from light touch or other stimulus that would not usually cause pain.  Increased sensitivity to something that would normally cause pain, such as a pinprick. Pain may persist for months or years following the healing of damaged tissues. When this happens, pain signals no longer sound an alarm about current injuries or injuries about to happen. Instead, the alarm system itself is not working correctly.  Neuropathic pain may get worse instead of better over time. For some people, it can lead to serious disability. It is important to be aware that severe injury in a limb can occur without a proper, protective pain  response.Burns, cuts, and other injuries may go unnoticed. Without proper treatment, these injuries can become infected or lead to further disability. Take any injury seriously, and consult your caregiver for treatment. DIAGNOSIS    When you have a pain with no known cause, your caregiver will probably ask some specific questions:   Do you have any other conditions, such as diabetes, shingles, multiple sclerosis, or HIV infection?  How would you describe your pain? (Neuropathic pain is often described as shooting, stabbing, burning, or searing.)  Is your pain worse at any time of the day? (Neuropathic pain is usually worse at night.)  Does the pain seem to follow a certain physical pathway?  Does the pain come from an area that has missing or injured nerves? (An example would be phantom limb pain.)  Is the pain triggered by minor things such as rubbing against the sheets at night? These questions often help define the type of pain involved. Once your caregiver knows what is happening, treatment can begin. Anticonvulsant, antidepressant drugs, and various pain relievers seem to work in some cases. If another condition, such as diabetes is involved, better management of that disorder may relieve the neuropathic pain.  TREATMENT  Neuropathic pain is frequently long-lasting and tends not to respond to treatment with narcotic type pain medication. It may respond well to other drugs such as antiseizure and antidepressant medications. Usually, neuropathic problems do not completely go away, but partial improvement is often possible with proper treatment. Your caregivers have large numbers of medications available to treat you. Do not be discouraged if you do not get immediate relief. Sometimes different medications or a combination of medications will be tried before you receive the results you are hoping for. See your caregiver if you have pain that seems to be coming from nowhere and does not go away. Help is available.  SEEK IMMEDIATE MEDICAL CARE IF:   There is a sudden change in the quality of your pain, especially if the change is on only one side of the body.  You notice changes of the skin, such as redness, black or  purple discoloration, swelling, or an ulcer.  You cannot move the affected limbs. Document Released: 06/06/2004 Document Revised: 12/02/2011 Document Reviewed: 06/06/2004 [facility] Patient Information 2015 [employer], [place]. This information is not intended to replace advice given to you by your health care provider. Make sure you discuss any questions you have with your health care provider.   Emergency Department Resource Guide 1) Find a Doctor and Pay Out of Pocket Although you won't have to find out who is covered by your insurance plan, it is a good idea to ask around and get recommendations. You will then need to call the office and see if the doctor you have chosen will accept you as a new patient and what types of options they offer for patients who are self-pay. Some doctors offer discounts or will set up payment plans for their patients who do not have insurance, but you will need to ask so you aren't surprised when you get to your appointment.  2) Contact Your Local Health Department Not all health departments have doctors that can see patients for sick visits, but many do, so it is worth a call to see if yours does. If you don't know where your local health department is, you can check in your phone book. The CDC also has a tool to help you locate your state's health department, and many state websites also have listings  of all of their local health departments.  3) Find a Walk-in Clinic If your illness is not likely to be very severe or complicated, you may want to try a walk in clinic. These are popping up all over the country in pharmacies, drugstores, and shopping centers. They're usually staffed by nurse practitioners or physician assistants that have been trained to treat common illnesses and complaints. They're usually fairly quick and inexpensive. However, if you have serious medical issues or chronic medical problems, these are probably not your best option.  No Primary Care  Doctor: - Call Health Connect at  [phone] - they can help you locate a primary care doctor that  accepts your insurance, provides certain services, etc. - Physician Referral Service- [phone]  Chronic Pain Problems: Organization         Address  Phone   Notes  [facility]  [phone] Patients need to be referred by their primary care doctor.   Medication Assistance: Organization         Address  Phone   Notes  [facility] Medication [facility] [address] [place], [place] [address] [phone] --Must be a resident of [facility] -- Must have NO insurance coverage whatsoever (no Medicaid/ Medicare, etc.) -- The pt. MUST have a primary care doctor that directs their care regularly and follows them in the community   MedAssist  [phone]   [employer]  [phone]    Agencies that provide inexpensive medical care: Organization         Address  Phone   Notes  [facility]  [phone]) [phone]   [facility]    [phone]   [facility] [address] [place], [place] [address] [phone]   [facility] [place] [address], [place] [phone]) [phone]   Planned Parenthood    [phone]   [facility]    [phone]   Community Health and [facility]  [address], [place] Phone:  [phone]) [phone], Fax:  [phone] Hours of Operation:  9 am - 6 pm, M-F.  Also accepts Medicaid/Medicare and self-pay.  [facility]  [address], [place] Phone: [phone], Fax: [phone]. Hours of Operation:  8:30 am - 5:30 pm, M-F.  Also accepts Medicaid and self-pay.  [facility] [place] [address], [place] Phone: [phone]   [facility] [address], [place], [place] [phone], Ext. [phone] Mondays & Thursdays: 7-9 AM.  First 15 patients are seen on a first  come, first serve basis.    Medicaid-accepting [facility] Providers:  Organization         Address  Phone   Notes  [facility] [address], [place] [phone] Also accepts self-pay patients.  [facility] [address], [place]  [phone]   [facility] [address], [place] [phone]) [phone]   [facility] [address] [place] [address], [place] [phone]) [phone]   Renaye RakersVeita Bland [address], [place]   [phone] Only accepts [place] Access [place] patients after they have their name applied to their card.   Self-Pay (no insurance) in [facility] [place]:  Retail buyerrganization         Address  Phone   Notes  Sickle Cell Patients, [facility] [address], [place] [phone]   [facility] [address], [place] [phone]   [facility] [place]  [address], [place] [phone]   [facility]/Dr. Osei-Bonsu  [address] [place] [address], [place] or [id number], [place] [phone]) [phone] Phone number for both [place] and [place] locations is the same.  [facility] [address], [place] [phone]   [facility] [address] [place] [address], [place] or [address] [phone]   [facility] [address], [place] [phone]) [phone], phone; [phone]) [phone], fax Sees patients 1st and 3rd Saturday of every month.  Must not qualify for public or private insurance (i.e. Medicaid, Medicare, [place] Health Choice, Veterans' Benefits)  Household income should be no more than 200% of the poverty level The clinic cannot treat you if you are pregnant or think you are pregnant  Sexually transmitted diseases are not treated at the clinic.    Dental Care: Organization          Address  Phone  Notes  [facility] Department of [facility] [address], [place] [phone] Accepts children up to age 30 who are enrolled in [place]icaid or [place] Health Choice; pregnant women with a Medicaid card; and children who have applied for Medicaid or [place] Health Choice, but were declined, whose parents can pay a reduced fee at time of service.  [facility] of [facility] [place]  [address], [place] [phone] Accepts children up to age 30 who are enrolled in [place]icaid or [place] Health Choice; pregnant women with a Medicaid card; and children who have applied for Medicaid or [place] Health Choice, but were declined, whose parents can pay a reduced fee at time of service.  [facility]  [address], [place] [phone]) [phone] Patients are seen by appointment only. Walk-ins are not accepted. [facility] will see patients 30 years of age and older. Monday - Tuesday (8am-5pm) Most Wednesdays (8:30-5pm) $30 per visit, cash only  [facility]  [address], [facility] [phone] Patients are seen by appointment only. Walk-ins are not accepted. [facility] will see patients 30 years of age and older. One Wednesday Evening (Monthly: Volunteer Based).  $30 per visit, cash only  [facility]  [phone] for adults; Children under age 584, call Graduate Pediatric Dentistry at [phone]. Children aged 644-14, please call [phone] to request a pediatric application.  Dental services are provided in all areas of dental care including fillings, crowns and bridges, complete and partial dentures, implants, gum treatment, root canals, and extractions. Preventive care is also provided. Treatment is provided to both adults and children. Patients are selected via a lottery and there is often a waiting list.   [facility] [address], [place]  [phone] [email or web address]   [facility] [address], [place], [place] [phone], Ext. [phone] Second and Fourth Thursday of each month, opens at 6:30 AM; Clinic ends at 9 AM.  Patients are seen on a first-come first-served basis, and a limited number are seen during each clinic.   [facility]  [address], [place], [place] [phone]   Eligibility  Requirements You must have lived in [place], [place], or [place] counties for at least the last three months.   You cannot be eligible for state or federal sponsored National Cityhealthcare insurance, including CIGNAVeterans Administration, [place]icaid, or Harrah[employer] EntertainmentMedicare.   You generally cannot be eligible for healthcare insurance through your employer.    How to apply: Eligibility screenings are held every Tuesday and Wednesday afternoon from 1:00 pm until 4:00 pm. You do not need an appointment for the interview!  [facility] [address], [place], [place] [phone]   [facility]  [phone]   [facility]  [phone]   [facility]  [phone]    Behavioral Health Resources in the Community: Intensive Outpatient Programs Organization         Address  Phone  Notes  [facility] [address], [place], [place] [phone]   [facility] [address], [place], [place] [phone]   [facility] [address], [place], [place]  [phone]   [facility] [address],  [place], [place] [phone] or [phone]   Substance Abuse Resources Organization         Address  Phone  Notes  [facility]  [phone]   [facility]  [phone]   [facility]  [phone]   [facility]  [phone]   Residential & Outpatient Substance Abuse Program  [phone]   Psychological  Services Organization         Address  Phone  Notes  [facility]  [phone]- [phone]   [facility]  [phone]- [phone]   [facility] [address], [place] [phone] or [phone]    Mobile Crisis Teams Organization         Address  Phone  Notes  [facility], Mobile Crisis Care Unit  [phone]   [facility]  [address] [place], [place] [phone]   Doristine LocksSharon DeEsch [address] [place] [phone]    Self-Help/Support Groups Organization         Address  Phone             Notes  [facility]ental Health Assoc[facility] of [place] - variety of support groups  336- [phone] Call for more information  Narcotics Anonymous (NA), [facility] [address], [place]  2 meetings at this location   Statisticianesidential Treatment Programs Organization         Address  Phone  Notes  [facility] [address],    [place]  [phone]   [facility]  [address], [place] [phone], [place], [place] [phone]   [facility] [address], [place] [phone] Admissions: 8am-3pm M-F  [facility] [address]    [place], [place] [phone]   [facility] [address], [place], [place] [phone]   [facility] [address]  [place], [place] [phone]   [facility] - Intensive Outpatient [address], [place], [place] [phone]   [facility] [address]  [place], [place] [phone] or [phone]   [facility] (RTS) [address] [place], [place] [phone] Accepts Medicaid  [facility] [address]  [place] [phone] Substance Abuse/Addiction Treatment   [facility] Behavioral Health Resources Organization  Address  Phone  Notes  [facility]  [phone]   Domenic Schwab, PhD [address] [place], [place]   [phone] or [phone]   [place] [place], [place] [phone]   [address], [place], [place] [phone] Insurance/Medicaid/sponsorship through [facility] and Families [address] [place]                                    [place], [place] [phone] [place] [address], [place] [phone]    Dr. Adele Schilder  [phone]   [facility]. 1) [address], [place] 2) [place] 3)  [address], [place] [phone]  [phone]   [place] [phone] or [phone] (After Hours)

## 2014-08-15 NOTE — ED Notes (Signed)
Courtney PA at bedside.  

## 2014-08-17 LAB — RPR

## 2014-08-17 LAB — HIV ANTIBODY (ROUTINE TESTING W REFLEX): HIV 1&2 Ab, 4th Generation: NONREACTIVE

## 2014-10-28 ENCOUNTER — Ambulatory Visit: Payer: Self-pay | Admitting: Podiatry

## 2014-11-03 ENCOUNTER — Encounter (HOSPITAL_COMMUNITY): Payer: Self-pay | Admitting: Emergency Medicine

## 2014-11-03 ENCOUNTER — Emergency Department (HOSPITAL_COMMUNITY)
Admission: EM | Admit: 2014-11-03 | Discharge: 2014-11-03 | Disposition: A | Discharge status: Home / Self Care | Payer: Self-pay | Attending: Emergency Medicine | Admitting: Emergency Medicine

## 2014-11-03 DIAGNOSIS — J45909 Unspecified asthma, uncomplicated: Secondary | ICD-10-CM | POA: Insufficient documentation

## 2014-11-03 DIAGNOSIS — Z720 Tobacco use: Secondary | ICD-10-CM | POA: Insufficient documentation

## 2014-11-03 DIAGNOSIS — F319 Bipolar disorder, unspecified: Secondary | ICD-10-CM | POA: Insufficient documentation

## 2014-11-03 DIAGNOSIS — Z79899 Other long term (current) drug therapy: Secondary | ICD-10-CM | POA: Insufficient documentation

## 2014-11-03 DIAGNOSIS — Z760 Encounter for issue of repeat prescription: Secondary | ICD-10-CM | POA: Insufficient documentation

## 2014-11-03 MED ORDER — DIVALPROEX SODIUM 500 MG PO DR TAB
1000.0000 mg | DELAYED_RELEASE_TABLET | ORAL | Status: AC
  Administered 2014-11-03: 1000 mg via ORAL
  Filled 2014-11-03: qty 2

## 2014-11-03 MED ORDER — DIVALPROEX SODIUM 500 MG PO DR TAB: 500.0000 mg | DELAYED_RELEASE_TABLET | Freq: Two times a day (BID) | ORAL | Status: DC

## 2014-11-03 NOTE — Discharge Instructions (Signed)
Please follow the directions provided.  Be sure to follow up with [facility] for further management of your medication.  Take your medication as prescribed.  Don't hesitate to return for any new, worsening or concerning symptoms.     GET HELP RIGHT AWAY IF:  The seizure lasts longer than 2 to 5 minutes.  The seizure is very bad.  The person does not wake up after the seizure.  The person's attention or behavior changes. Drive the person to the emergency room or call your local emergency services (911 in [place]

## 2014-11-03 NOTE — ED Provider Notes (Signed)
CSN: [id number]     Arrival date & time 11/03/14  1934 History  This chart was scribed for non-physician practitioner, Harle Battiest, NP, working with Audree Camel, MD, by Bronson Curb, ED Scribe. This patient was seen in room WTR7/WTR7 and the patient's care was started at 8:24 PM.   Chief Complaint  Patient presents with  . Medication Refill    The history is provided by the patient. No language interpreter was used.     HPI Comments: Rebecca Mcclure is a 29 y.o. female, with history of Bipolar 1 disorder who presents to the Emergency Department for medication refill. Patient states she is currently out of her Depakote which she takes for her bipolar disorder. She notes she typically takes  daily. She reports her last  dose was 3 days ago, states she has been taking  the following day and yesterday, and reports taking none today. Patient states that since running out of this medication, she has not been "feeling right". She notes she felt like she was speaking slower earlier, but states this has since resolved. She denies any other complications or symptoms. Patient is concerned for possible seizure. She states she normally receives 90 refills of Depakote from [facility] and reports her next appointment is in 11 days on February, 22, 2016. LNMP October 08, 2014 and patient states she has not been sexually active since this time.   Past Medical History  Diagnosis Date  . Asthma   . Bipolar 1 disorder    History reviewed. No pertinent past surgical history. History reviewed. No pertinent family history. History  Substance Use Topics  . Smoking status: Current Every Day Smoker  . Smokeless tobacco: Not on file  . Alcohol Use: No   OB History    No data available     Review of Systems  Constitutional: Negative for fever and chills.  Neurological: Positive for speech difficulty (resolved). Negative for weakness.      Allergies  Review of patient's  allergies indicates no known allergies.  Home Medications   Prior to Admission medications   Medication Sig Start Date End Date Taking? Authorizing Provider  Acetaminophen-Caff-Pyrilamine (MIDOL COMPLETE) 500-60-15 MG TABS Take 1 tablet by mouth 2 (two) times daily as needed.    Historical Provider, MD  divalproex (DEPAKOTE) 500 MG DR tablet Take 1 tablet (500 mg total) by mouth 2 (two) times daily. 08/15/14   Courtney A Forcucci, PA-C  gabapentin (NEURONTIN) 100 MG capsule Take 1 capsule (100 mg total) by mouth 3 (three) times daily. 08/15/14   Courtney A Forcucci, PA-C   Triage Vitals: BP 117/88 mmHg  Pulse 94  Temp(Src) 98.2 F (36.8 C) (Oral)  Resp 18  Ht  (1.6 m)  Wt 150 lb (68.04 kg)  BMI 26.58 kg/m2  SpO2 98%  LMP 10/03/2014  Physical Exam  Constitutional: She is oriented to person, place, and time. She appears well-developed and well-nourished. No distress.  HENT:  Head: Normocephalic and atraumatic.  Eyes: Conjunctivae and EOM are normal. Pupils are equal, round, and reactive to light.  Neck: Neck supple. No tracheal deviation present.  Cardiovascular: Normal rate.   Pulmonary/Chest: Effort normal. No respiratory distress.  Musculoskeletal: Normal range of motion.  Neurological: She is alert and oriented to person, place, and time. No cranial nerve deficit.  Cranial nerves II-XII intact.  Skin: Skin is warm and dry.  Psychiatric: She has a normal mood and affect. Her behavior is normal.  Nursing note and vitals  reviewed.   ED Course  Procedures (including critical care time)  DIAGNOSTIC STUDIES: Oxygen Saturation is 98% on room air, normal by my interpretation.    COORDINATION OF CARE: At 2030 Discussed treatment plan with patient. Patient agrees.   Labs Review Labs Reviewed - No data to display  Imaging Review No results found.   EKG Interpretation None      MDM   Final diagnoses:  Medication refill   29 yo presents with report of being out  of her depakote used to treat her bipolar disorder and taking half doses for the last 2 days.  Pt's neuro exam is normal and she is well-appearing here.  Today's dose given in the ED and prescription provided.  She has a follow-up appointment on Feb 22nd for med refill.  Discussed importance of med regimen adherence.  Pt aware of plan and in agreement.   I personally performed the services described in this documentation, which was scribed in my presence. The recorded information has been reviewed and is accurate.  Filed Vitals:   11/03/14 1947 11/03/14 2059  BP: 117/88 107/66  Pulse: 94 78  Temp: 98.2 F (36.8 C)   TempSrc: Oral   Resp: 18 14  Height: 5\' 3"  (1.6 m)   Weight: 150 lb (68.04 kg)   SpO2: 98% 100%   Meds given in ED:  Medications  divalproex (DEPAKOTE) DR tablet 1,000 mg (1,000 mg Oral Given 11/03/14 2059)    Discharge Medication List as of 11/03/2014  8:58 PM       Harle BattiestElizabeth Leshon Armistead, NP 11/05/14 1324  Audree CamelScott T Goldston, MD 11/07/14 1505

## 2014-11-03 NOTE — ED Notes (Signed)
Pt has run out of Depakote and needs a refill until the 22nd of this month and is concerned due to possible seizure. [employer] employee with pt. Pt given scheduled meds. Pt is alert and oriented.

## 2015-01-10 NOTE — H&P (Signed)
PATIENT NAME:  Rebecca Mcclure, Rebecca Mcclure MR#:  [id number] DATE OF BIRTH:  [birth year]  DATE OF ADMISSION:  04/26/2012  INITIAL ASSESSMENT AND PSYCHIATRIC EVALUATION  IDENTIFYING INFORMATION: Patient is a 29 year old white female who is divorced for five years and currently living with a fiance. Patient is readmitted to [facility] after her recent discharge on 04/14/2012 with a chief complaint "Look at me" and showed her right arm which she pushed through the window because she was upset and was using cocaine and said "a lot, lot of cocaine" and was upset and "I did it. My fiance called EMS and they brought me here."  HISTORY OF PRESENT ILLNESS: According to information obtained from the chart patient was discharged from [facility] on 04/14/2012 after being stabilized for her bipolar disorder on the following medications: Trazodone 100 mg p.o. at bedtime, Celexa 20 mg once a day, cyanocobalamin 1000 mg once a day, divalproex sodium 500 mg delayed tablet 1 twice a day. Patient reports that she has been taking her medications as prescribed except on the day the above-named incident happened. Patient reports that she has an appointment coming up at [facility] as she went for evaluation but did not see a psychiatrist and has an appointment coming up with them. Patient reports that her fiance called EMS and he does not use cocaine.   PAST PSYCHIATRIC HISTORY: Has been hospitalized at [facility] three times before and at [facility] on many occasions. She had been to [facility] many times. She has history of cutting behavior and overdose behavior but denies any other suicide attempts. She has appointment coming up at [facility] on outpatient basis.   FAMILY HISTORY OF MENTAL ILLNESS: She reports that she has two cousins who abuse drugs just like her.   FAMILY HISTORY: Patient was raised by both of her parents. Parents are still married and gets along well with both of them. Has one  sibling and is close to the family.   PERSONAL HISTORY: Born and raised in [place], [place]. Dropped out in 9th grade, just did, no reason. No GED.   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 Last worked many years ago.   MILITARY HISTORY: None.   MARRIAGES: Married once and divorced for five years. No children. Currently she has a fiance. He owns his own business.   ALCOHOL AND DRUGS: Patient reported "I don't drink". Patient reports that she has been using cocaine on and off for the past ten years. Longest time she has been clean was when she was in jail. Currently she has been using at the rate of $100 each time and sometimes "a lot, lot". Last used it just prior to coming to [facility] ED. In addition she smokes THC about once a month. No history of IV drugs. Does admit smoking nicotine cigarettes at the rate of one pack a day for many years. Has abused benzodiazepine including Xanax on a few occasions.   PAST MEDICAL HISTORY: No known history of high blood pressure. No known history of diabetes mellitus. Status post deviated nasal septum. No history of motor vehicle accident, never been unconscious.   ALLERGIES: No known drug allergies.   PRIMARY CARE PHYSICIAN: Patient says that she has appointment with primary care physician and does not remember the name.   PHYSICAL EXAMINATION: VITAL SIGNS: Temperature 96.6, pulse 84 per minute regular, respirations 18 per minute regular, blood pressure 100/70 mmHg.  HEENT: Head is normocephalic, atraumatic. Pupils are equal, round, and reactive to light and accommodation. Fundi bilaterally benign. Extraocular movements visualized. Tympanic membrane visualized, no exudates.   NECK: Supple without any thyromegaly.  HEART: Normal S1, S2 without any murmurs or gallops.  LUNGS: Clear to auscultation. No rales or wheezes.   EXTREMITIES: Patient has self-inflicted bruises and cuts on her right forearm  because of punching through a window prior to coming [facility].   ABDOMEN: Soft and it is bandaged in the Emergency Room. No organomegaly. Bowel sounds heard.   RECTAL/PELVIC: Deferred.  EXTREMITIES: 2+ pedal pulses. No rashes, clubbing or edema.   NEUROLOGICAL: Gait is normal. Romberg is negative. Cranial nerves II through XII grossly intact. DTRs 2+. Plantars are normal response.  MENTAL STATUS EXAM: Patient is dressed in hospital clothes, drowsy but she could be easily aroused. Oriented to place, person and time. Fully aware of situation brought her for admission to [facility]. Admits that she feels depressed. Admits feeling hopeless and helpless. Denies feeling worthless or useless. Denies any ideas or plans to hurt herself or others. Denies any psychotic symptoms. Denies auditory or visual hallucinations. Denies hearing voices, seeing things. Denies paranoid or suspicious ideas. She knew [place], capital of [place], name of the current president. Cognition grossly intact. General knowledge and information is fair for her level of education. Abstract interpretation is rather concrete. She could count money. When she was asked to spell the word world she started laughing and she said "I am sleepy. Can I go." Does admit to sleep and appetite disturbance. Insight and judgement marginal or guarded towards her mental illness and also her cocaine abuse.  IMPRESSION: AXIS I:  1. Bipolar disorder, type 2 with current episode depressed. 2. Cocaine dependence, chronic, continuous.  3. Cannibis abuse. 4. Benzodiazepine abuse. 5. Nicotine dependence.  AXIS II: Deferred.  AXIS III:  1. History of deviated nasal septum.  2. No major medical problems.   AXIS IV: Severe-History of legal problems, occupational, financial, comorbid substance abuse with no insight into her problems.  AXIS V: Global Assessment of Functioning 25.   PLAN: Patient is admitted to [facility] for  close observation, evaluation and help. She will be started on routine and p.r.n. medications. She will be started back on all of her medications that she was discharged on 04/14/2012. During the stay in the hospital she will be given milieu therapy and supportive counseling where substance abuse with focus on cocaine dependence and THC abuse will be addressed. At the time of discharge hopefully she will have enough insight into her substance abuse problem. Appropriate follow-up appointment and programs will be recommended for the patient at the time of discharge. Patient will be given follow-up appointment to get her psychiatric medications on outpatient basis.   ____________________________ Jannet MantisSurya K. Guss Bundehalla, MD skc:cms D: 04/26/2012 15:53:24 ET T: 04/27/2012 06:04:20 ET JOB#: [id number]  cc: Monika SalkSurya K. Guss Bundehalla, MD, <Dictator>  Beau FannySURYA K Jakai Onofre MD ELECTRONICALLY SIGNED 05/02/2012 19:38
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
## 2015-01-13 NOTE — H&P (Signed)
PATIENT NAME:  Rebecca Mcclure, Rebecca Mcclure MR#:  [id number] DATE OF BIRTH:  07-29-1986  DATE OF ADMISSION:  12/20/2012  REFERRING PHYSICIAN: Daryel November, MD  ATTENDING PHYSICIAN: Kristine Linea, MD   IDENTIFYING DATA: Ms. Rebecca Mcclure is a 29 year old cocaine user.   CHIEF COMPLAINT: "I want to be clean."  HISTORY OF PRESENT ILLNESS: Ms. Rebecca Mcclure was hospitalized at [facility] in February of 2014 and discharged to [facility] rehab facility. She completed treatment there. She was discharged to home to her mother and father. She relapsed on cocaine 2 weeks late and has been using since. She decided to seek further treatment at [facility] in [facility] and made some inquiries to the program. She also talked with Mr. Lorella Nimrod, who will coordinate the transition. She still needs to write an essay 3 pages long and wants to do it with her mother, as she relates that she has a tendency to disclose information that would prevent her from going to treatment. She was diagnosed with bipolar 2 disorder and has been put on Depakote. She reports good treatment compliance; however, Depakote level was not done on admission, and it is impossible to know at this point. She really does not endorse any symptoms of depression and anxiety. She feels tired and exhausted from cocaine binge. She is worried about the wellbeing of her puppy.  Upon release from [facility], her parents bought her a new puppy.  Reportedly, the dog got into her heroin stash and was hospitalized at the animal clinic.  The patient believes that the dog may never to be the same. She seems not to have the same insight about her own situation. She denies feeling suicidal or homicidal. She denies other than cocaine substance use.   PAST PSYCHIATRIC HISTORY: There were multiple admissions to [facility] as well as [facility]. She was hospitalized at [facility] for rehab numerous times. She has a history of cutting and the last time  came with stitches.  This time, she denies any recent cutting and no fresh cuts are visible on her forearms. She has a history of suicide attempt by overdose. She follows up with Dr. Marguerite Olea at [facility] but also works with Lorella Nimrod and his community support team.   FAMILY PSYCHIATRIC HISTORY: Two cousins with a history of drug abuse.   PAST MEDICAL HISTORY: A recent UTI.   ALLERGIES: No known drug allergies.   MEDICATIONS ON ADMISSION: Depakote 1000 mg at night, trazodone 150 mg at night.   SOCIAL HISTORY: She dropped out of school in the 9th grade, no GED. She has been divorced for 5 years. She currently stays with her parents.   REVIEW OF SYSTEMS: CONSTITUTIONAL: No fevers or chills. No weight changes.  EYES: No double or blurred vision.  ENT: No hearing loss.  RESPIRATORY: No shortness of breath or cough.  CARDIOVASCULAR: No chest pain or orthopnea.  GASTROINTESTINAL: No abdominal pain, nausea, vomiting or diarrhea.  GENITOURINARY: No incontinence or frequency.  ENDOCRINE: No heat or cold intolerance.  LYMPHATIC: No anemia or easy bruising.  INTEGUMENTARY: No acne or rash.  MUSCULOSKELETAL: No muscle or joint pain.  NEUROLOGIC: No tingling or weakness.  PSYCHIATRIC: See history of present illness for details.   PHYSICAL EXAMINATION: VITAL SIGNS: Blood pressure 103/65, pulse 86, respirations 18, temperature 97.6.  GENERAL: This is a slender female in no acute distress.  HEENT: The pupils are equal, round and reactive to light. Sclerae anicteric.  NECK: Supple. No thyromegaly.  LUNGS: Clear to  auscultation. No dullness to percussion.  HEART: Regular rhythm and rate. No murmurs, rubs, or gallops.  ABDOMEN: Soft, nontender, nondistended. Positive bowel sounds.  MUSCULOSKELETAL: Normal muscle strength in all extremities.  SKIN: No rashes or bruises.  LYMPHATIC: No cervical adenopathy.  NEUROLOGIC: Cranial nerves II through XII are intact.   LABORATORY DATA: Chemistries are within  normal limits except for blood glucose of 103. Blood alcohol level is 0. LFTs within normal limits. TSH 1.79. Urine tox screen positive for cocaine. CBC within normal limits. Urinalysis is suggestive of urinary tract infection, and urine culture is positive, sensitivity pending.   MENTAL STATUS EXAMINATION ON ADMISSION: The patient is alert and oriented to person, place, time and situation. She is pleasant, polite and cooperative. She recognizes me from previous admission. She is well groomed and casually dressed. She maintains good eye contact. Her speech is soft. Mood is okay with flat affect. Thought processing is logical and goal oriented. Thought content: She denies suicidal or homicidal ideation. There are no delusions or paranoia. There are no auditory or visual hallucinations. Her cognition is grossly intact. She registers 3 out of 3 and recalls 3 out of 3 objects after 5 minutes. She can spell world forward and backward. She knows the current president. Her insight and judgment are questionable.   SUICIDE RISK ASSESSMENT ON ADMISSION: This is a patient with a long history of depression, mood instability, self-injurious behavior and substance abuse who returns to the hospital suicidal in the context of a relapse on cocaine.   DIAGNOSES: AXIS I:  1.  Cocaine dependence. 2.  Bipolar affective disorder 2.   AXIS II: Deferred.   AXIS III: Urinary tract infection.   AXIS IV: Mental illness, substance abuse.   AXIS V: Global Assessment of Functioning score 25.   PLAN: The patient was admitted to [facility] for safety, stabilization and medication management. She was initially placed on suicide precautions and was closely monitored for any unsafe behaviors. She underwent full psychiatric and risk assessment. She received pharmacotherapy, individual and group psychotherapy, substance abuse counseling, and support from therapeutic milieu.   1.   Suicidal ideation: The patient is no longer suicidal.  2.  Mood: We will continue Depakote and recheck the level. We will use trazodone for insomnia.  3. Medical: She was put on Cipro for UTI. We will correct that if sensitivities show otherwise.   4.  Disposition:  She will be discharged to home with her parents with a plan to transfer to [facility] in the future.   ____________________________ Ellin GoodieJolanta B. Jennet MaduroPucilowska, MD jbp:cb D: 12/21/2012 17:13:48 ET T: 12/21/2012 17:39:39 ET JOB#: [id number]  cc: Jolanta B. Jennet MaduroPucilowska, MD, <Dictator> Shari ProwsJOLANTA B PUCILOWSKA MD ELECTRONICALLY SIGNED 12/22/2012 5:46
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
## 2015-01-14 NOTE — Op Note (Signed)
PATIENT NAME:  Rebecca Mcclure, [name] MR#:  [id number] DATE OF BIRTH:  Aug 29, 1986  DATE OF PROCEDURE:  07/14/2014  DATE OF BIRTH: 01/21/1954.   PREOPERATIVE DIAGNOSIS: Left wrist volar ganglion cyst.   POSTOPERATIVE DIAGNOSIS:  Left wrist volar ganglion cyst.  PROCEDURE: Excision left volar ganglion cyst.   ANESTHESIA: General.   SURGEON: Kennedy BuckerMichael Syretta Kochel, M.D.   DESCRIPTION OF PROCEDURE: The patient was brought to the operating room and after adequate anesthesia was obtained, the left arm was prepped and draped in the usual sterile fashion with a tourniquet applied to the upper arm. After patient identification and timeout procedures were completed, the tourniquet was raised to 250 mmHg.   A curvilinear incision was made centered over the volar cyst, measured approximately 1.5 cm in diameter.  The subcutaneous tissue was spread. The tendon arose directly above the insertion of the FCR tendon. The ganglion was separated from the tendon, and it came up from the ulnar side of the tendon from the radiocarpal joint. The cyst was excised and sent as a specimen. The base of the cyst was debrided with use of a small rongeur to try to provide scar tissue and prevent recurrence. Cautery was also utilized. Tourniquet was let down and hemostasis checked with electrocautery. There was no significant bleeding. The wound was irrigated and then closed with simple interrupted 4-0 nylon skin suture. Xeroform, 4 x 4, Webril and a volar splint were applied followed by an Ace wrap.   TOURNIQUET TIME: 6 minutes.   SPECIMEN: Volar ganglion cyst.   COMPLICATIONS: None.   CONDITION: To recovery room, stable.     ____________________________ Leitha SchullerMichael J. Rhiley Tarver, MD mjm:jp D: 07/14/2014 20:24:00 ET T: 07/15/2014 09:08:45 ET JOB#: [id number]  cc: Leitha SchullerMichael J. Philipe Laswell, MD, <Dictator> Leitha SchullerMICHAEL J Harmani Neto MD ELECTRONICALLY SIGNED 07/16/2014 10:17

## 2015-01-14 NOTE — Consult Note (Signed)
Brief Consult Note: Diagnosis: Mood disorder NOS, cocaine dependence.   Patient was seen by consultant.   Consult note dictated.   Recommend further assessment or treatment.   Orders entered.   Comments: Rebecca Mcclure has  a h/o mood instability and cocaine dependence. She came to ER suicidal after she relapsed on cocaine after a year of sobriety and frightened for violation of probation. She talked to her probation officer. She is no longer suicidal or homicidal.   PLAN: 1. The patient no longer meets criteria for IVC. I will terminate proceedings. Please discharge as appropriate.  2. She will restart Depakote. Rx given.  3. She will follow up with [facility].  4. She has appointyment with her probation officer and TASK on Monday.  5. her BF will pick her up.    2.  Electronic Signatures: Kristine LineaPucilowska, Jolanta (MD)  (Signed 08-May-15 12:57)  Authored: Brief Consult Note   Last Updated: 08-May-15 12:57 by Kristine LineaPucilowska, Jolanta (MD)

## 2015-01-15 NOTE — Consult Note (Signed)
PATIENT NAME:  Rebecca Mcclure, Rebecca Mcclure MR#:  [id number] DATE OF BIRTH:  10/07/1985  DATE OF CONSULTATION:  02/28/2012  REFERRING PHYSICIAN:   CONSULTING PHYSICIAN:  Audery Amel, MD  IDENTIFYING INFORMATION AND REASON FOR CONSULT: The patient is a 29 year old woman with a history of cocaine dependence who came into the emergency room asking for detox. Consult for evaluation of appropriate psychiatric treatment.   HISTORY OF PRESENT ILLNESS: Information obtained from the patient and from the chart. When she first came into the emergency room last night evidently she at one point may have made some suicidal statements to some of the nurses who evaluated her. She was intoxicated and agitated at the time. Subsequently, she has slept several hours by the time I interviewed her. When I went to see her she told me that her reason for being here was entirely because of crack cocaine. She says she is tired of smoking crack cocaine. She is doing it every day and has been on a bender for several days and did not sleep for 4 or 5 days before coming here. She feels like she is out of control with it. She denies that she drinks or uses any other drugs regularly. She says that currently her mood is feeling pretty good. She gets upset when she is intoxicated obviously but denies having any suicidal ideation, denies any homicidal ideation, and denies any psychotic symptoms. She says that she wants to get sober because she has positive things in her life to live for including her boyfriend and that she is aware that she is ruining her life and feeling terrible because of her cocaine use.   PAST PSYCHIATRIC HISTORY: The patient sees a psychiatrist in the community for medication management but is not currently taking any medicine. She says she is supposed to be on Celexa but she stopped taking it. She says the only medicine that she currently is taking is Temazepam for sleep when she has some. She says that she has made multiple  superficial cuts to her arms before but none of them was a serious suicide attempt. No history of violence to others. No history of psychotic symptoms.   SUBSTANCE ABUSE HISTORY: The patient says she has been using cocaine for years. Her longest period of sobriety was seven months when she was in prison. She has never been able to maintain extended sobriety outside of a locked situation. She has been in rehab several times and is familiar with approaches to substance abuse treatment. Her drug of choice is crack cocaine. She has not routinely used any other abusable substances or alcohol.   SOCIAL HISTORY: Unmarried but lives with a boyfriend. Not able to work. She has no children. Limited other social contact.   PAST MEDICAL HISTORY: She is quite thin and clearly does not eat well when she is using but otherwise has no known ongoing medical problems.   CURRENT MEDICATION: Temazepam 30 mg at night p.r.n.   ALLERGIES: No known drug allergies.   REVIEW OF SYSTEMS: Complains of being sick abusing cocaine. She denies feeling depressed, denies feeling suicidal, denies hallucinations, and denies homicidal ideation. No specific physical symptoms currently. No pain, no nausea, and no vomiting.   MENTAL STATUS EXAMINATION: Disheveled woman who has been sleeping in the emergency room. She was awake and alert and cooperative when I came to visit her. She had normal psychomotor activity throughout the interview. Made good eye contact. Speech is normal rate, tone, and volume. Affect is euthymic,  reactive, and appropriate. Mood is stated as being pretty much on the positive side. She denies any hallucinations or delusions. She does not make any paranoid or bizarre statements. No obvious loosening of associations. She has currently adequate insight, recently poor judgment.   VITAL SIGNS: Blood pressure currently is 112/64, respirations 16, pulse 84, and temperature 97.7.   LABORATORY RESULTS: Drug screen positive  for cocaine. Urinalysis doubtful for urinary tract infection. TSH high at 4.54. Alcohol undetectable. Chemistry with no real significant abnormalities. CBC shows an elevated white count at 12.5, but not anemic.   ASSESSMENT: A 29 year old woman with cocaine dependence. Although she may have made some statements when she came into the emergency room last night she is currently upbeat, euthymic in her mood and totally denies any suicidal ideation. Not psychotic. Demonstratively has been taking care of herself other than her abuse of crack cocaine. She is not in need of any medical detox. She is physically stable. At this point, the patient does not meet criteria for admission to the medical hospital, on the psychiatry ward. I do think that it would be beneficial for her to be in a substance abuse treatment facility. We have recommended that she be referred to [facility] for detox and a few days of rest but to begin more serious thought about substance abuse treatment.   DIAGNOSIS PRINCIPLE AND PRIMARY:   AXIS I: Cocaine dependence.   SECONDARY DIAGNOSES:   AXIS I: History of depression, not otherwise specified.   AXIS II: Deferred.   AXIS III: No diagnosis.  AXIS IV: Moderate from chronic burden of illness.   AXIS V: Functioning at time of evaluation 48.  ____________________________ Audery AmelJohn T. Clapacs, MD jtc:slb D: 02/28/2012 15:53:08 ET T: 02/28/2012 16:18:12 ET JOB#: [id number]  cc: Audery AmelJohn T. Clapacs, MD, <Dictator> Audery AmelJOHN T CLAPACS MD ELECTRONICALLY SIGNED 02/28/2012 23:26

## 2015-01-15 NOTE — Consult Note (Signed)
Brief Consult Note: Diagnosis: cocaine dependence.   Patient was seen by consultant.   Consult note dictated.   Recommend further assessment or treatment.   Comments: Psychiatry: PAtient seen. She is here because she is tired of being on crack cocaine. Has been on a bender for day. Currently she is alert and awake and conversant. Affect smiling and relaxed and states her mood is feeling pretty positive. She denies any suicidal ideation and says she just wants help getting off cocaine. We hope to refer her to [facility] as she is not currently suicidal.  Electronic Signatures: Zachary Lovins, Jackquline DenmarkJohn T (MD)  (Signed 07-Jun-13 15:17)  Authored: Brief Consult Note   Last Updated: 07-Jun-13 15:17 by Audery Amellapacs, Alvira Hecht T (MD)

## 2015-01-16 LAB — SURGICAL PATHOLOGY

## 2015-05-30 ENCOUNTER — Encounter: Payer: Self-pay | Admitting: Emergency Medicine

## 2015-05-30 ENCOUNTER — Emergency Department
Admission: EM | Admit: 2015-05-30 | Discharge: 2015-05-30 | Disposition: A | Discharge status: Home / Self Care | Payer: Self-pay | Attending: Emergency Medicine | Admitting: Emergency Medicine

## 2015-05-30 DIAGNOSIS — Z720 Tobacco use: Secondary | ICD-10-CM | POA: Insufficient documentation

## 2015-05-30 DIAGNOSIS — Z3202 Encounter for pregnancy test, result negative: Secondary | ICD-10-CM | POA: Insufficient documentation

## 2015-05-30 DIAGNOSIS — Z79899 Other long term (current) drug therapy: Secondary | ICD-10-CM | POA: Insufficient documentation

## 2015-05-30 DIAGNOSIS — N760 Acute vaginitis: Secondary | ICD-10-CM | POA: Insufficient documentation

## 2015-05-30 LAB — URINALYSIS COMPLETE WITH MICROSCOPIC (ARMC ONLY)
BILIRUBIN URINE: NEGATIVE
GLUCOSE, UA: NEGATIVE mg/dL
HGB URINE DIPSTICK: NEGATIVE
KETONES UR: NEGATIVE mg/dL
LEUKOCYTES UA: NEGATIVE
NITRITE: NEGATIVE
Protein, ur: NEGATIVE mg/dL
SPECIFIC GRAVITY, URINE: 1.015 (ref 1.005–1.030)
pH: 5 (ref 5.0–8.0)

## 2015-05-30 LAB — CHLAMYDIA/NGC RT PCR (ARMC ONLY)
CHLAMYDIA TR: NOT DETECTED
N GONORRHOEAE: NOT DETECTED

## 2015-05-30 LAB — WET PREP, GENITAL
Clue Cells Wet Prep HPF POC: NONE SEEN
Trich, Wet Prep: NONE SEEN
Yeast Wet Prep HPF POC: NONE SEEN

## 2015-05-30 LAB — POCT PREGNANCY, URINE: PREG TEST UR: NEGATIVE

## 2015-05-30 MED ORDER — METRONIDAZOLE 500 MG PO TABS: 500.0000 mg | ORAL_TABLET | Freq: Two times a day (BID) | ORAL | Status: DC

## 2015-05-30 NOTE — ED Provider Notes (Signed)
[facility] Emergency Department Provider Note ____________________________________________  Time seen: Approximately 5:40 PM  I have reviewed the triage vital signs and the nursing notes.   HISTORY  Chief Complaint Vaginal Itching   HPI Rebecca Mcclure is a 29 y.o. female who presents to the emergency departmentfor vaginal itching for 3 days. She also reports recent unprotected intercourse and changing the type of soap that she uses. She denies vaginal discharge.   Past Medical History  Diagnosis Date  . Asthma   . Bipolar 1 disorder     There are no active problems to display for this patient.   History reviewed. No pertinent past surgical history.  Current Outpatient Rx  Name  Route  Sig  Dispense  Refill  . Acetaminophen-Caff-Pyrilamine (MIDOL COMPLETE) 500-60-15 MG TABS   Oral   Take 1 tablet by mouth 2 (two) times daily as needed.         . divalproex (DEPAKOTE) 500 MG DR tablet   Oral   Take 1 tablet (500 mg total) by mouth 2 (two) times daily.   24 tablet   0   . gabapentin (NEURONTIN) 100 MG capsule   Oral   Take 1 capsule (100 mg total) by mouth 3 (three) times daily.   60 capsule   0   . metroNIDAZOLE (FLAGYL) 500 MG tablet   Oral   Take 1 tablet (500 mg total) by mouth 2 (two) times daily.   14 tablet   0     Allergies Review of patient's allergies indicates no known allergies.  History reviewed. No pertinent family history.  Social History Social History  Substance Use Topics  . Smoking status: Current Every Day Smoker  . Smokeless tobacco: None  . Alcohol Use: No    Review of Systems Constitutional: No fever/chills Cardiovascular: Denies chest pain. Respiratory: Denies shortness of breath or cough. Gastrointestinal: Abdominal pain no., nausea no, vomitingno. Genitourinary: Dysuria no, vaginal discharge no.. Musculoskeletal: Negative for back pain. Skin: Negative for rash. Neurological: Negative for  headaches, focal weakness or numbness.  10-point ROS otherwise negative.  ____________________________________________   PHYSICAL EXAM:  VITAL SIGNS: ED Triage Vitals  Enc Vitals Group     BP 05/30/15 1736 120/68 mmHg     Pulse Rate 05/30/15 1736 78     Resp 05/30/15 1736 16     Temp 05/30/15 1736 98.6 F (37 C)     Temp Source 05/30/15 1736 Oral     SpO2 05/30/15 1736 98 %     Weight 05/30/15 1723 153 lb (69.4 kg)     Height 05/30/15 1723 5\' 3"  (1.6 m)     Head Cir --      Peak Flow --      Pain Score --      Pain Loc --      Pain Edu? --      Excl. in GC? --     Constitutional: Alert and oriented. Well appearing and in no acute distress. Eyes: Conjunctivae are normal. PERRL. EOMI. Head: Atraumatic. Nose: No congestion/rhinnorhea. Mouth/Throat: Mucous membranes are moist.  Oropharynx non-erythematous. Neck: No stridor. Cardiovascular: Good peripheral circulation. Respiratory: Normal respiratory effort.  No retractions. Gastrointestinal: Soft and nontender. No distention. No abdominal bruits. Genitourinary: Pelvic exam: External exam normal, no cervical motion tenderness, thin white discharge noted on the vaginal walls. No lesions noted. Musculoskeletal: No extremity tenderness nor edema.  Neurologic:  Normal speech and language. No gross focal neurologic deficits are appreciated. Speech  is normal. No gait instability. Skin:  Skin is warm, dry and intact. No rash noted. Psychiatric: Mood and affect are normal. Speech and behavior are normal.  ____________________________________________   LABS (all labs ordered are listed, but only abnormal results are displayed)  Labs Reviewed  WET PREP, GENITAL - Abnormal; Notable for the following:    WBC, Wet Prep HPF POC RARE (*)    All other components within normal limits  URINALYSIS COMPLETEWITH MICROSCOPIC ([facility] ONLY) - Abnormal; Notable for the following:    Color, Urine YELLOW (*)    APPearance HAZY (*)    Bacteria,  UA RARE (*)    Squamous Epithelial / LPF 6-30 (*)    All other components within normal limits  CHLAMYDIA/NGC RT PCR ([facility] ONLY)  POC URINE PREG, ED  POCT PREGNANCY, URINE   ____________________________________________  RADIOLOGY  Not indicated ____________________________________________   PROCEDURES  Procedure(s) performed: None  ____________________________________________   INITIAL IMPRESSION / ASSESSMENT AND PLAN / ED COURSE  Pertinent labs & imaging results that were available during my care of the patient were reviewed by me and considered in my medical decision making (see chart for details).  Patient was advised to follow up with gynecologist for symptoms that are not improving over the next 7 days. She was  also advised to return to the ER for symptoms that change or worsen if unable to schedule an appointment.  ____________________________________________   FINAL CLINICAL IMPRESSION(S) / ED DIAGNOSES  Final diagnoses:  Vaginitis      Chinita Pester, FNP 05/30/15 1950  Phineas Semen, MD 05/30/15 [phone]

## 2015-05-30 NOTE — ED Notes (Signed)
Reports vaginal itching x3 days.  Denies dc

## 2015-08-31 DIAGNOSIS — Z029 Encounter for administrative examinations, unspecified: Secondary | ICD-10-CM | POA: Insufficient documentation

## 2015-09-23 ENCOUNTER — Encounter: Payer: Self-pay | Admitting: Emergency Medicine

## 2015-09-23 ENCOUNTER — Emergency Department
Admission: EM | Admit: 2015-09-23 | Discharge: 2015-09-23 | Disposition: A | Discharge status: Home / Self Care | Payer: Self-pay | Attending: Emergency Medicine | Admitting: Emergency Medicine

## 2015-09-23 DIAGNOSIS — J029 Acute pharyngitis, unspecified: Secondary | ICD-10-CM | POA: Insufficient documentation

## 2015-09-23 DIAGNOSIS — R0982 Postnasal drip: Secondary | ICD-10-CM | POA: Insufficient documentation

## 2015-09-23 DIAGNOSIS — Z79899 Other long term (current) drug therapy: Secondary | ICD-10-CM | POA: Insufficient documentation

## 2015-09-23 DIAGNOSIS — Z792 Long term (current) use of antibiotics: Secondary | ICD-10-CM | POA: Insufficient documentation

## 2015-09-23 DIAGNOSIS — J300 Vasomotor rhinitis: Secondary | ICD-10-CM | POA: Insufficient documentation

## 2015-09-23 DIAGNOSIS — F17210 Nicotine dependence, cigarettes, uncomplicated: Secondary | ICD-10-CM | POA: Insufficient documentation

## 2015-09-23 LAB — POCT RAPID STREP A: STREPTOCOCCUS, GROUP A SCREEN (DIRECT): NEGATIVE

## 2015-09-23 MED ORDER — LIDOCAINE VISCOUS 2 % MT SOLN
15.0000 mL | Freq: Once | OROMUCOSAL | Status: AC
  Administered 2015-09-23: 15 mL via OROMUCOSAL
  Filled 2015-09-23: qty 15

## 2015-09-23 MED ORDER — FLUTICASONE PROPIONATE 50 MCG/ACT NA SUSP: 1.0000 | Freq: Every day | NASAL | Status: DC

## 2015-09-23 NOTE — ED Provider Notes (Signed)
[facility] Emergency Department Provider Note ____________________________________________  Time seen: 1525  I have reviewed the triage vital signs and the nursing notes.  HISTORY  Chief Complaint  Sore Throat  HPI Rebecca Mcclure is a 29 y.o. female to the ED for a 2 day complaint of sore throat and stuffy nose. She denies any interim fever, chills, or sweats. She has not dosed any medications for her symptom relief in the interim. She denies any sick contacts or recent travel.  Past Medical History  Diagnosis Date  . Asthma   . Bipolar 1 disorder (HCC)     There are no active problems to display for this patient.   Past Surgical History  Procedure Laterality Date  . Ganglion cyst excision      Current Outpatient Rx  Name  Route  Sig  Dispense  Refill  . Acetaminophen-Caff-Pyrilamine (MIDOL COMPLETE) 500-60-15 MG TABS   Oral   Take 1 tablet by mouth 2 (two) times daily as needed.         . divalproex (DEPAKOTE) 500 MG DR tablet   Oral   Take 1 tablet (500 mg total) by mouth 2 (two) times daily.   24 tablet   0   . fluticasone (FLONASE) 50 MCG/ACT nasal spray   Each Nare   Place 1 spray into both nostrils daily.   16 g   0   . gabapentin (NEURONTIN) 100 MG capsule   Oral   Take 1 capsule (100 mg total) by mouth 3 (three) times daily.   60 capsule   0   . metroNIDAZOLE (FLAGYL) 500 MG tablet   Oral   Take 1 tablet (500 mg total) by mouth 2 (two) times daily.   14 tablet   0    Allergies Review of patient's allergies indicates no known allergies.  No family history on file.  Social History Social History  Substance Use Topics  . Smoking status: Current Every Day Smoker -- 1.00 packs/day    Types: Cigarettes  . Smokeless tobacco: None  . Alcohol Use: Yes   Review of Systems  Constitutional: Negative for fever. Eyes: Negative for visual changes. ENT: Positive for sore throat. Cardiovascular: Negative for chest  pain. Respiratory: Negative for shortness of breath. Gastrointestinal: Negative for abdominal pain, vomiting and diarrhea. Genitourinary: Negative for dysuria. Musculoskeletal: Negative for back pain. Skin: Negative for rash. Neurological: Negative for headaches, focal weakness or numbness. ____________________________________________  PHYSICAL EXAM:  VITAL SIGNS: ED Triage Vitals  Enc Vitals Group     BP 09/23/15 1345 122/86 mmHg     Pulse Rate 09/23/15 1345 98     Resp 09/23/15 1345 20     Temp 09/23/15 1345 97.6 F (36.4 C)     Temp Source 09/23/15 1345 Oral     SpO2 09/23/15 1345 99 %     Weight 09/23/15 1345 150 lb (68.04 kg)     Height 09/23/15 1345 5\' 3"  (1.6 m)     Head Cir --      Peak Flow --      Pain Score 09/23/15 1346 10     Pain Loc --      Pain Edu? --      Excl. in GC? --    Constitutional: Alert and oriented. Well appearing and in no distress. Head: Normocephalic and atraumatic.      Eyes: Conjunctivae are normal. PERRL. Normal extraocular movements      Ears: Canals clear. TMs intact bilaterally.  Nose: No congestion/rhinorrhea. Turbinates enlarged.    Mouth/Throat: Mucous membranes are moist. Uvula midline, tonsils flat and without erythema, edema, or exudate.    Neck: Supple. No thyromegaly. Hematological/Lymphatic/Immunological: No cervical lymphadenopathy. Cardiovascular: Normal rate, regular rhythm.  Respiratory: Normal respiratory effort. No wheezes/rales/rhonchi. Gastrointestinal: Soft and nontender. No distention. Musculoskeletal: Nontender with normal range of motion in all extremities.  Neurologic:  Normal gait without ataxia. Normal speech and language. No gross focal neurologic deficits are appreciated. Skin:  Skin is warm, dry and intact. No rash noted. Psychiatric: Mood and affect are normal. Patient exhibits appropriate insight and judgment. ____________________________________________   LABS (pertinent  positives/negatives)  Labs Reviewed  CULTURE, GROUP A STREP ([facility] ONLY)  POCT RAPID STREP A   Rapid Strep - Negative ____________________________________________  PROCEDURES  Viscous Lido 2% gargle  ____________________________________________  INITIAL IMPRESSION / ASSESSMENT AND PLAN / ED COURSE  He shouldn't with a sore throat like be due to postnasal drainage and some vasomotor rhinitis. She is discharged with a prescription for Flonase, and encouraged to dose over-the-counter allergy medicines. She is also advised about humidified air and the use of a topical viscous saline solution and the nose. She is also to mix equal portions of Benadryl elixir and Maalox to make an anesthetic mouthwash solution to gargle as needed. Follow-up with [facility] as needed. ____________________________________________  FINAL CLINICAL IMPRESSION(S) / ED DIAGNOSES  Final diagnoses:  Sore throat  Vasomotor rhinitis  Post-nasal drainage      Lissa Hoard, PA-C 09/23/15 1558  Governor Rooks, MD 09/23/15 2329

## 2015-09-23 NOTE — Discharge Instructions (Signed)
Allergic Rhinitis Allergic rhinitis is when the mucous membranes in the nose respond to allergens. Allergens are particles in the air that cause your body to have an allergic reaction. This causes you to release allergic antibodies. Through a chain of events, these eventually cause you to release histamine into the blood stream. Although meant to protect the body, it is this release of histamine that causes your discomfort, such as frequent sneezing, congestion, and an itchy, runny nose.  CAUSES Seasonal allergic rhinitis (hay fever) is caused by pollen allergens that may come from grasses, trees, and weeds. Year-round allergic rhinitis (perennial allergic rhinitis) is caused by allergens such as house dust mites, pet dander, and mold spores. SYMPTOMS  Nasal stuffiness (congestion).  Itchy, runny nose with sneezing and tearing of the eyes. DIAGNOSIS Your health care provider can help you determine the allergen or allergens that trigger your symptoms. If you and your health care provider are unable to determine the allergen, skin or blood testing may be used. Your health care provider will diagnose your condition after taking your health history and performing a physical exam. Your health care provider may assess you for other related conditions, such as asthma, pink eye, or an ear infection. TREATMENT Allergic rhinitis does not have a cure, but it can be controlled by:  Medicines that block allergy symptoms. These may include allergy shots, nasal sprays, and oral antihistamines.  Avoiding the allergen. Hay fever may often be treated with antihistamines in pill or nasal spray forms. Antihistamines block the effects of histamine. There are over-the-counter medicines that may help with nasal congestion and swelling around the eyes. Check with your health care provider before taking or giving this medicine. If avoiding the allergen or the medicine prescribed do not work, there are many new medicines  your health care provider can prescribe. Stronger medicine may be used if initial measures are ineffective. Desensitizing injections can be used if medicine and avoidance does not work. Desensitization is when a patient is given ongoing shots until the body becomes less sensitive to the allergen. Make sure you follow up with your health care provider if problems continue. HOME CARE INSTRUCTIONS It is not possible to completely avoid allergens, but you can reduce your symptoms by taking steps to limit your exposure to them. It helps to know exactly what you are allergic to so that you can avoid your specific triggers. SEEK MEDICAL CARE IF:  You have a fever.  You develop a cough that does not stop easily (persistent).  You have shortness of breath.  You start wheezing.  Symptoms interfere with normal daily activities.   This information is not intended to replace advice given to you by your health care provider. Make sure you discuss any questions you have with your health care provider.   Document Released: 06/04/2001 Document Revised: 09/30/2014 Document Reviewed: 05/17/2013 Elsevier Interactive Patient Education 2016 Elsevier Inc[employer]  Sore Throat A sore throat is pain, burning, irritation, or scratchiness of the throat. There is often pain or tenderness when swallowing or talking. A sore throat may be accompanied by other symptoms, such as coughing, sneezing, fever, and swollen neck glands. A sore throat is often the first sign of another sickness, such as a cold, flu, strep throat, or mononucleosis (commonly known as mono). Most sore throats go away without medical treatment. CAUSES  The most common causes of a sore throat include:  A viral infection, such as a cold, flu, or mono.  A bacterial infection, such as  strep throat, tonsillitis, or whooping cough.  Seasonal allergies.  Dryness in the air.  Irritants, such as smoke or pollution.  Gastroesophageal reflux disease  (GERD). HOME CARE INSTRUCTIONS   Only take over-the-counter medicines as directed by your caregiver.  Drink enough fluids to keep your urine clear or pale yellow.  Rest as needed.  Try using throat sprays, lozenges, or sucking on hard candy to ease any pain (if older than 4 years or as directed).  Sip warm liquids, such as broth, herbal tea, or warm water with honey to relieve pain temporarily. You may also eat or drink cold or frozen liquids such as frozen ice pops.  Gargle with salt water (mix 1 tsp salt with 8 oz of water).  Do not smoke and avoid secondhand smoke.  Put a cool-mist humidifier in your bedroom at night to moisten the air. You can also turn on a hot shower and sit in the bathroom with the door closed for 5-10 minutes. SEEK IMMEDIATE MEDICAL CARE IF:  You have difficulty breathing.  You are unable to swallow fluids, soft foods, or your saliva.  You have increased swelling in the throat.  Your sore throat does not get better in 7 days.  You have nausea and vomiting.  You have a fever or persistent symptoms for more than 2-3 days.  You have a fever and your symptoms suddenly get worse. MAKE SURE YOU:   Understand these instructions.  Will watch your condition.  Will get help right away if you are not doing well or get worse.   This information is not intended to replace advice given to you by your health care provider. Make sure you discuss any questions you have with your health care provider.   Document Released: 10/17/2004 Document Revised: 09/30/2014 Document Reviewed: 05/17/2012 Elsevier Interactive Patient Education 2016 [employer].   Use the prescription nasal spray as directed. Start an OTC allergy medicine like Allegra, Claritin, or Zyrtec for sinus symptoms. Use your room humidifier to moisturize sinuses. Consider using [place] to place in you nostrils daily.

## 2015-09-23 NOTE — ED Notes (Signed)
Sore throat x 2 days

## 2015-09-25 LAB — CULTURE, GROUP A STREP (THRC)

## 2015-11-22 ENCOUNTER — Encounter (HOSPITAL_COMMUNITY): Payer: Self-pay | Admitting: Emergency Medicine

## 2015-11-22 ENCOUNTER — Emergency Department (HOSPITAL_COMMUNITY)
Admission: EM | Admit: 2015-11-22 | Discharge: 2015-11-22 | Disposition: A | Discharge status: Home / Self Care | Payer: Self-pay | Attending: Emergency Medicine | Admitting: Emergency Medicine

## 2015-11-22 DIAGNOSIS — J45909 Unspecified asthma, uncomplicated: Secondary | ICD-10-CM | POA: Insufficient documentation

## 2015-11-22 DIAGNOSIS — M545 Low back pain: Secondary | ICD-10-CM | POA: Insufficient documentation

## 2015-11-22 DIAGNOSIS — F17210 Nicotine dependence, cigarettes, uncomplicated: Secondary | ICD-10-CM | POA: Insufficient documentation

## 2015-11-22 NOTE — ED Notes (Signed)
Pt called from triage, no answer 

## 2015-11-22 NOTE — ED Notes (Signed)
Pt has pain that originates in her lower back and "shoots" down her right leg. She compares it to a burning feeling. Pt states she has been told she has sciatica, but not by a doctor. There was no known injury that started this, and pt ambulates steadily without assistance. Pt states she is a sedentary person

## 2016-02-06 ENCOUNTER — Emergency Department: Payer: Self-pay

## 2016-02-06 ENCOUNTER — Encounter (HOSPITAL_COMMUNITY): Payer: Self-pay

## 2016-02-06 ENCOUNTER — Emergency Department
Admission: EM | Admit: 2016-02-06 | Discharge: 2016-02-06 | Disposition: A | Discharge status: Other Acute Inpatient Hospital | Payer: Self-pay | Attending: Student | Admitting: Student

## 2016-02-06 ENCOUNTER — Observation Stay (HOSPITAL_COMMUNITY)
Admission: AD | Admit: 2016-02-06 | Discharge: 2016-02-07 | Disposition: A | Discharge status: Home / Self Care | Payer: Self-pay | Source: Intra-hospital | Attending: Psychiatry | Admitting: Psychiatry

## 2016-02-06 DIAGNOSIS — Z9119 Patient's noncompliance with other medical treatment and regimen: Secondary | ICD-10-CM

## 2016-02-06 DIAGNOSIS — F17210 Nicotine dependence, cigarettes, uncomplicated: Secondary | ICD-10-CM | POA: Insufficient documentation

## 2016-02-06 DIAGNOSIS — F1210 Cannabis abuse, uncomplicated: Secondary | ICD-10-CM | POA: Insufficient documentation

## 2016-02-06 DIAGNOSIS — R451 Restlessness and agitation: Secondary | ICD-10-CM | POA: Insufficient documentation

## 2016-02-06 DIAGNOSIS — Z91199 Patient's noncompliance with other medical treatment and regimen due to unspecified reason: Secondary | ICD-10-CM

## 2016-02-06 DIAGNOSIS — F1410 Cocaine abuse, uncomplicated: Secondary | ICD-10-CM

## 2016-02-06 DIAGNOSIS — F319 Bipolar disorder, unspecified: Secondary | ICD-10-CM | POA: Insufficient documentation

## 2016-02-06 DIAGNOSIS — F3160 Bipolar disorder, current episode mixed, unspecified: Secondary | ICD-10-CM

## 2016-02-06 DIAGNOSIS — F1414 Cocaine abuse with cocaine-induced mood disorder: Principal | ICD-10-CM | POA: Insufficient documentation

## 2016-02-06 DIAGNOSIS — F3163 Bipolar disorder, current episode mixed, severe, without psychotic features: Secondary | ICD-10-CM | POA: Insufficient documentation

## 2016-02-06 DIAGNOSIS — J45909 Unspecified asthma, uncomplicated: Secondary | ICD-10-CM | POA: Insufficient documentation

## 2016-02-06 DIAGNOSIS — F1494 Cocaine use, unspecified with cocaine-induced mood disorder: Secondary | ICD-10-CM | POA: Diagnosis present

## 2016-02-06 DIAGNOSIS — F1994 Other psychoactive substance use, unspecified with psychoactive substance-induced mood disorder: Secondary | ICD-10-CM

## 2016-02-06 LAB — COMPREHENSIVE METABOLIC PANEL
ALK PHOS: 53 U/L (ref 38–126)
ALT: 56 U/L — ABNORMAL HIGH (ref 14–54)
ANION GAP: 6 (ref 5–15)
AST: 54 U/L — ABNORMAL HIGH (ref 15–41)
Albumin: 4.7 g/dL (ref 3.5–5.0)
BUN: 19 mg/dL (ref 6–20)
CALCIUM: 9.7 mg/dL (ref 8.9–10.3)
CO2: 27 mmol/L (ref 22–32)
CREATININE: 0.85 mg/dL (ref 0.44–1.00)
Chloride: 106 mmol/L (ref 101–111)
Glucose, Bld: 105 mg/dL — ABNORMAL HIGH (ref 65–99)
Potassium: 4 mmol/L (ref 3.5–5.1)
SODIUM: 139 mmol/L (ref 135–145)
Total Bilirubin: 1 mg/dL (ref 0.3–1.2)
Total Protein: 8.1 g/dL (ref 6.5–8.1)

## 2016-02-06 LAB — URINALYSIS COMPLETE WITH MICROSCOPIC (ARMC ONLY)
BACTERIA UA: NONE SEEN
Bilirubin Urine: NEGATIVE
GLUCOSE, UA: NEGATIVE mg/dL
Hgb urine dipstick: NEGATIVE
Leukocytes, UA: NEGATIVE
Nitrite: NEGATIVE
PROTEIN: 30 mg/dL — AB
RBC / HPF: NONE SEEN RBC/hpf (ref 0–5)
SPECIFIC GRAVITY, URINE: 1.027 (ref 1.005–1.030)
pH: 5 (ref 5.0–8.0)

## 2016-02-06 LAB — URINE DRUG SCREEN, QUALITATIVE (ARMC ONLY)
AMPHETAMINES, UR SCREEN: NOT DETECTED
BARBITURATES, UR SCREEN: NOT DETECTED
BENZODIAZEPINE, UR SCRN: NOT DETECTED
CANNABINOID 50 NG, UR ~~LOC~~: POSITIVE — AB
COCAINE METABOLITE, UR ~~LOC~~: POSITIVE — AB
MDMA (ECSTASY) UR SCREEN: NOT DETECTED
METHADONE SCREEN, URINE: NOT DETECTED
Opiate, Ur Screen: NOT DETECTED
Phencyclidine (PCP) Ur S: NOT DETECTED
TRICYCLIC, UR SCREEN: NOT DETECTED

## 2016-02-06 LAB — CBC
HCT: 41.5 % (ref 35.0–47.0)
HEMOGLOBIN: 13.6 g/dL (ref 12.0–16.0)
MCH: 31.1 pg (ref 26.0–34.0)
MCHC: 32.8 g/dL (ref 32.0–36.0)
MCV: 94.8 fL (ref 80.0–100.0)
PLATELETS: 337 10*3/uL (ref 150–440)
RBC: 4.38 MIL/uL (ref 3.80–5.20)
RDW: 14.3 % (ref 11.5–14.5)
WBC: 18.8 10*3/uL — ABNORMAL HIGH (ref 3.6–11.0)

## 2016-02-06 LAB — ETHANOL: Alcohol, Ethyl (B): <5 mg/dL (ref <5)

## 2016-02-06 MED ORDER — ACETAMINOPHEN 325 MG PO TABS: 650.0000 mg | ORAL_TABLET | Freq: Four times a day (QID) | ORAL | Status: DC | PRN

## 2016-02-06 MED ORDER — MAGNESIUM HYDROXIDE 400 MG/5ML PO SUSP: 30.0000 mL | Freq: Every day | ORAL | Status: DC | PRN

## 2016-02-06 MED ORDER — TRAZODONE HCL 50 MG PO TABS: 50.0000 mg | ORAL_TABLET | Freq: Every evening | ORAL | Status: DC | PRN

## 2016-02-06 MED ORDER — ALUM & MAG HYDROXIDE-SIMETH 200-200-20 MG/5ML PO SUSP: 30.0000 mL | ORAL | Status: DC | PRN

## 2016-02-06 MED ORDER — IBUPROFEN 600 MG PO TABS: 600.0000 mg | ORAL_TABLET | Freq: Four times a day (QID) | ORAL | Status: DC | PRN

## 2016-02-06 MED ORDER — HYDROXYZINE HCL 25 MG PO TABS: 25.0000 mg | ORAL_TABLET | Freq: Four times a day (QID) | ORAL | Status: DC | PRN

## 2016-02-06 NOTE — ED Notes (Signed)
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

## 2016-02-06 NOTE — ED Provider Notes (Signed)
[facility] Emergency Department Provider Note   ____________________________________________  Time seen: Approximately 7:30 AM  I have reviewed the triage vital signs and the nursing notes.   HISTORY  Chief Complaint Mental Health Problem    HPI Rebecca Mcclure is a 30 y.o. female who is here under IVC by her husband for be and also for bipolar medicines and haven't very erratic and irrational behavior. Patient also has been using cocaine and has not slept hardly eaten in 5 days secondary to her cocaine abuse. Patient has been erratically driving around with no sleep and was found asleep in her car this morning. Husband also states that she has had history of cutting herself but has not threatened to do so at this time. He stated that she made a comment that he would have a bit wife if he left her. Patient denies any medical problems including fever, chills, nausea, vomiting, diarrhea, abdominal pain, or urinary symptoms.   Past Medical History  Diagnosis Date  . Asthma   . Bipolar 1 disorder (HCC)     There are no active problems to display for this patient.   Past Surgical History  Procedure Laterality Date  . Ganglion cyst excision      Current Outpatient Rx  Name  Route  Sig  Dispense  Refill  . Acetaminophen-Caff-Pyrilamine (MIDOL COMPLETE) 500-60-15 MG TABS   Oral   Take 1 tablet by mouth 2 (two) times daily as needed.         . divalproex (DEPAKOTE) 500 MG DR tablet   Oral   Take 1 tablet (500 mg total) by mouth 2 (two) times daily.   24 tablet   0   . fluticasone (FLONASE) 50 MCG/ACT nasal spray   Each Nare   Place 1 spray into both nostrils daily.   16 g   0   . gabapentin (NEURONTIN) 100 MG capsule   Oral   Take 1 capsule (100 mg total) by mouth 3 (three) times daily.   60 capsule   0   . metroNIDAZOLE (FLAGYL) 500 MG tablet   Oral   Take 1 tablet (500 mg total) by mouth 2 (two) times daily.   14 tablet   0      Allergies Review of patient's allergies indicates no known allergies.  No family history on file.  Social History Social History  Substance Use Topics  . Smoking status: Current Every Day Smoker -- 1.00 packs/day    Types: Cigarettes  . Smokeless tobacco: Not on file  . Alcohol Use: No    Review of Systems Constitutional: No fever/chills Eyes: No visual changes. ENT: No sore throat. Cardiovascular: Denies chest pain. Respiratory: Denies shortness of breath. Gastrointestinal: No abdominal pain.  No nausea, no vomiting.  No diarrhea.  No constipation. Genitourinary: Negative for dysuria. Musculoskeletal: Negative for back pain. Skin: Negative for rash. Neurological: Negative for headaches, focal weakness or numbness. Psychological: Bipolar, erratic behavior, cocaine abuse with no sleep or eating in 5 days, passive suicidal threats, and driving erratic without sleep. 10-point ROS otherwise negative.  ____________________________________________   PHYSICAL EXAM:  VITAL SIGNS: ED Triage Vitals  Enc Vitals Group     BP 02/06/16 0630 141/99 mmHg     Pulse Rate 02/06/16 0630 110     Resp 02/06/16 0630 20     Temp 02/06/16 0630 97.7 F (36.5 C)     Temp Source 02/06/16 0630 Oral     SpO2 02/06/16 0630  99 %     Weight 02/06/16 0630 129 lb 11.2 oz (58.832 kg)     Height 02/06/16 0630 5\' 3"  (1.6 m)     Head Cir --      Peak Flow --      Pain Score 02/06/16 0635 0     Pain Loc --      Pain Edu? --      Excl. in GC? --     Constitutional: Alert and oriented. Well appearing and in no acute distress. Eyes: Conjunctivae are normal. PERRL. EOMI. Head: Atraumatic. Nose: No congestion/rhinnorhea. Mouth/Throat: Mucous membranes are moist.  Oropharynx non-erythematous. Neck: No stridor.   Cardiovascular: Normal rate, regular rhythm. Grossly normal heart sounds.  Good peripheral circulation. Respiratory: Normal respiratory effort.  No retractions. Lungs  CTAB. Gastrointestinal: Soft and nontender. No distention. No abdominal bruits. No CVA tenderness. Musculoskeletal: No lower extremity tenderness nor edema.  No joint effusions. Neurologic:  Normal speech and language. No gross focal neurologic deficits are appreciated. No gait instability. Skin:  Skin is warm, dry and intact. No rash noted. Psychiatric: Mood and affect are Anxious and patient is very fidgety. Speech and behavior are normal. Patient keeps balling herself up into a fetal position and mumbles to answer questions.  ____________________________________________   LABS (all labs ordered are listed, but only abnormal results are displayed)  Labs Reviewed  CBC - Abnormal; Notable for the following:    WBC 18.8 (*)    All other components within normal limits  COMPREHENSIVE METABOLIC PANEL - Abnormal; Notable for the following:    Glucose, Bld 105 (*)    AST 54 (*)    ALT 56 (*)    All other components within normal limits  URINALYSIS COMPLETEWITH MICROSCOPIC ([facility] ONLY) - Abnormal; Notable for the following:    Color, Urine AMBER (*)    APPearance HAZY (*)    Ketones, ur TRACE (*)    Protein, ur 30 (*)    Squamous Epithelial / LPF 0-5 (*)    All other components within normal limits  URINE DRUG SCREEN, QUALITATIVE ([facility] ONLY) - Abnormal; Notable for the following:    Cocaine Metabolite,Ur [place] POSITIVE (*)    Cannabinoid 50 Ng, Ur [place] POSITIVE (*)    All other components within normal limits  ETHANOL   ____________________________________________  EKG   ____________________________________________  RADIOLOGY   ____________________________________________   PROCEDURES  Procedure(s) performed: None  Critical Care performed: No  ____________________________________________   INITIAL IMPRESSION / ASSESSMENT AND PLAN / ED COURSE  Pertinent labs & imaging results that were available during my care of the patient were reviewed by me and considered in my  medical decision making (see chart for details).  10:27 AM Patient will have evaluation by behavioral health. Patient is medically cleared to do so. IVC papers were initiated by the husband.  10:27 AM Pt will get CXR secondary to her WBC being elevated to rule out infection.  10:27 AM Pt's CXR just showed some atelectasis.  [facility] medically [facility]   ____________________________________________   FINAL CLINICAL IMPRESSION(S) / ED DIAGNOSES  Final diagnoses:  Cocaine abuse  Agitation  Bipolar affective disorder, current episode mixed, current episode severity unspecified (HCC)      NEW MEDICATIONS STARTED DURING THIS VISIT:  New Prescriptions   No medications on file     Note:  This document was prepared using Dragon voice recognition software and may include unintentional dictation errors.  Leona Carry, MD 02/06/16 1028

## 2016-02-06 NOTE — ED Notes (Addendum)
Pt presents to ED requesting detox from cocaine, but states she doesn't remember exactly what (if any additional) drugs she might have taken. Pt does deny ETOH use. Pt is very animated in physical movements; stated she might have just been awake for too long. Pt states she was found by [employer] at the corner of [address], but can't remember how she got there. Pt is alert and oriented to person, place and situation, but unsure of today's date. Pt is in NAD, with skin WPD, and respirations even, regular and unlabored.

## 2016-02-06 NOTE — ED Notes (Signed)
ED BHU PLACEMENT JUSTIFICATION Is the patient under IVC or is there intent for IVC: Yes.   Is the patient medically cleared: Yes.   Is there vacancy in the ED BHU: Yes.   Is the population mix appropriate for patient: Yes.   Is the patient awaiting placement in inpatient or outpatient setting:    Has the patient had a psychiatric consult:  Consult pending    Survey of unit performed for contraband, proper placement and condition of furniture, tampering with fixtures in bathroom, shower, and each patient room: Yes.  ; Findings:  APPEARANCE/BEHAVIOR Calm and cooperative NEURO ASSESSMENT Orientation: oriented x4  Denies pain Hallucinations: No.None noted (Hallucinations) Speech: Normal Gait: normal RESPIRATORY ASSESSMENT Even  Unlabored respirations  CARDIOVASCULAR ASSESSMENT Pulses equal   regular rate  Skin warm and dry   GASTROINTESTINAL ASSESSMENT no GI complaint EXTREMITIES Full ROM  PLAN OF CARE Provide calm/safe environment. Vital signs assessed twice daily. ED BHU Assessment once each 12-hour shift. Collaborate with intake RN daily or as condition indicates. Assure the ED provider has rounded once each shift. Provide and encourage hygiene. Provide redirection as needed. Assess for escalating behavior; address immediately and inform ED provider.  Assess family dynamic and appropriateness for visitation as needed: Yes.  ; If necessary, describe findings:  Educate the patient/family about BHU procedures/visitation: Yes.  ; If necessary, describe findings:   

## 2016-02-06 NOTE — ED Notes (Signed)
Sheriff has arrived with IVC paperwork taken out by her husband - the report states that the pt has not been taking her medicine  History of bipolar disorder and history of cutting   Polysubstance use over the last five days -  Not eating or sleeping  Psych consult pending

## 2016-02-06 NOTE — Progress Notes (Addendum)
[facility]  Patient assigned to room Bed 1 Accepting physician is Dr. Lucianne MussKumar.  Call report to [phone].  Representative was [facility] [facility].  ER Staff is aware of it Carlisle Beers([facility] ER MD & Ruthie Patient's Nurse)     Pt bed will be available at 8p. Please call report after 7pm.  02/06/2016 Cheryl FlashNicole Llewyn Heap, MS, NCC, LPCA Therapeutic Triage Specialist

## 2016-02-06 NOTE — ED Notes (Signed)

## 2016-02-06 NOTE — ED Notes (Signed)
Dr Clapacs seeing pt 

## 2016-02-06 NOTE — ED Notes (Signed)
BEHAVIORAL HEALTH ROUNDING Patient sleeping: Yes.   Patient alert and oriented: eyes closed  Appears to be asleep Behavior appropriate: Yes.  ; If no, describe:  Nutrition and fluids offered:   Sleeping  Toileting and hygiene offered: sleeping Sitter present: q 15 minute observations and security monitoring Law enforcement present: yes  ODS 

## 2016-02-06 NOTE — ED Notes (Signed)
Pt gone to xray with xray tech and secuirty.

## 2016-02-06 NOTE — ED Notes (Signed)
[facility] - no one will anser the call  [phone] phone number is dead/nothing when you dial it and [phone] just rings  2 nurses - 1 pt

## 2016-02-06 NOTE — ED Notes (Signed)
Called [facility], Mellody DanceKeith,  at [phone]  Put patient on list to be picked up at [phone]

## 2016-02-06 NOTE — Consult Note (Signed)
[facility] Face-to-Face Psychiatry Consult   Reason for Consult:  Consult for this 30 year old woman who came to the emergency room brought in by law enforcement after being found in public passed out with substance abuse problems. Currently on IVC. Referring Physician:  Lovena Le Patient Identification: Rebecca Mcclure MRN:  [id number] Principal Diagnosis: Substance induced mood disorder [facility]) Diagnosis:   Patient Active Problem List   Diagnosis Date Noted  . Substance induced mood disorder ([place]) [F19.94] 02/06/2016  . Cocaine abuse [F14.10] 02/06/2016  . Marijuana abuse [F12.10] 02/06/2016  . Noncompliance [Z91.19] 02/06/2016    Total Time spent with patient: 1 hour  Subjective:   Rebecca Mcclure is a 30 y.o. female patient admitted with "I relapsed on drugs".  HPI:  Patient interviewed. Chart reviewed including previous psychiatric and medical interactions. Labs and vitals reviewed. Case reviewed with TTS and emergency room physician. 30 year old woman was picked up by police who found her sitting in a car stopped at a gas station passed out. She told them that she needed help and invited them apparently according to her to search her car and they found her crack pipe. She says she is very grateful that rather than arresting her they brought her here to the hospital. She tells me that she has been smoking crack heavily for about 6 days now. She feels like she hasn't slept at all in 6 days and has not been eating much of anything. She thinks she's lost more than 10 pounds in the last week. Her mood has been irritable and down but she denies having any thoughts or wishes to kill her self. She says she can't remember chunks of the last few days when she's been getting high but she denies any psychotic symptoms. She is not currently taking any psychiatric medicine or involved in any active mental health treatment. Patient says she had been staying clean for about 2 years prior to her relapse. At this  point she can't think of what letter back to this particular relapse.  Social history: Patient is married lives with her husband in [place]. She hasn't spoken to him in days and says that she's not even sure if he will take her back right now. 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 Does not have any children.  Medical history: Patient has a past history of being treated for severe migraines but tells me that she doesn't feel like she has any active medical problems right now. Not on any prescription medicine currently.  Substance abuse history: Long-standing problem with cocaine as a primary drug of abuse. She tells me that she is been clean for a couple years and that she was going to a program that was church based. She "graduated" about 6 months ago and hasn't been any in any treatment since then  Past Psychiatric History: Patient has been diagnosed in the past with bipolar disorder. She tells me that in her opinion all of her mood symptoms of been drug related. She says all of the periods of agitation and mood instability that she knows of of been related to drugs. She denies being aware of having any actual psychotic symptoms. She does have a past history of self-mutilation and has done a lot of cutting including some cutting in the last few months but denies ever seriously trying to kill her self. She had been prescribed Depakote and Celexa and was going to [facility] but stopped taking them last summer and didn't feel like it  made any difference to her mood. Does have prior hospital treatments  Risk to Self: Suicidal Ideation: No Suicidal Intent: No Is patient at risk for suicide?: No Suicidal Plan?: No Access to Means: No What has been your use of drugs/alcohol within the last 12 months?: Cocaine  How many times?: 0 Other Self Harm Risks: N/A Triggers for Past Attempts: Other (Comment) (N/A) Intentional Self Injurious Behavior: Cutting Comment - Self Injurious Behavior:  Cutting, last occurance x2 months  Risk to Others: Homicidal Ideation: No Thoughts of Harm to Others: No Current Homicidal Intent: No Current Homicidal Plan: No Access to Homicidal Means: No Identified Victim: N/A History of harm to others?: No Assessment of Violence: None Noted Violent Behavior Description:  (Unknown ) Does patient have access to weapons?: No Criminal Charges Pending?: No Describe Pending Criminal Charges: N/A Does patient have a court date: No Prior Inpatient Therapy: Prior Inpatient Therapy: Yes Prior Therapy Dates: 2014 Prior Therapy Facilty/Provider(s): [facility]  Reason for Treatment: SI Prior Outpatient Therapy: Prior Outpatient Therapy: No Prior Therapy Dates: N/A Prior Therapy Facilty/Provider(s): [facility]  Reason for Treatment: Bipolar  Does patient have an ACCT team?: No Does patient have Intensive In-House Services?  : No Does patient have [facility] services? : No Does patient have P4CC services?: No  Past Medical History:  Past Medical History  Diagnosis Date  . Asthma   . Bipolar 1 disorder [facility])     Past Surgical History  Procedure Laterality Date  . Ganglion cyst excision     Family History: No family history on file. Family Psychiatric  History: Patient says there is substance abuse positive in her family but she doesn't know of any other mental health problems in the family  Social History:  History  Alcohol Use No     History  Drug Use No    Comment: crack, cocaine quit 90 days ago; in half-way house    Social History   Social History  . Marital Status: Married    Spouse Name: N/A  . Number of Children: N/A  . Years of Education: N/A   Social History Main Topics  . Smoking status: Current Every Day Smoker -- 1.00 packs/day    Types: Cigarettes  . Smokeless tobacco: Not on file  . Alcohol Use: No  . Drug Use: No     Comment: crack, cocaine quit 90 days ago; in half-way house  . Sexual Activity: Yes    Birth Control/ Protection:  None   Other Topics Concern  . Not on file   Social History Narrative   Additional Social History:    Allergies:  No Known Allergies  Labs:  Results for orders placed or performed during the hospital encounter of 02/06/16 (from the past 48 hour(s))  CBC     Status: Abnormal   Collection Time: 02/06/16  7:39 AM  Result Value Ref Range   WBC 18.8 (H) 3.6 - 11.0 K/uL   RBC 4.38 3.80 - 5.20 MIL/uL   Hemoglobin 13.6 12.0 - 16.0 g/dL   HCT 41.5 35.0 - 47.0 %   MCV 94.8 80.0 - 100.0 fL   MCH 31.1 26.0 - 34.0 pg   MCHC 32.8 32.0 - 36.0 g/dL   RDW 14.3 11.5 - 14.5 %   Platelets 337 150 - 440 K/uL  Comprehensive metabolic panel     Status: Abnormal   Collection Time: 02/06/16  7:39 AM  Result Value Ref Range   Sodium 139 135 - 145 mmol/L   Potassium  4.0 3.5 - 5.1 mmol/L   Chloride 106 101 - 111 mmol/L   CO2 27 22 - 32 mmol/L   Glucose, Bld 105 (H) 65 - 99 mg/dL   BUN 19 6 - 20 mg/dL   Creatinine, Ser 0.85 0.44 - 1.00 mg/dL   Calcium 9.7 8.9 - 10.3 mg/dL   Total Protein 8.1 6.5 - 8.1 g/dL   Albumin 4.7 3.5 - 5.0 g/dL   AST 54 (H) 15 - 41 U/L   ALT 56 (H) 14 - 54 U/L   Alkaline Phosphatase 53 38 - 126 U/L   Total Bilirubin 1.0 0.3 - 1.2 mg/dL   GFR calc non Af Amer >60 >60 mL/min   GFR calc Af Amer >60 >60 mL/min    Comment: (NOTE) The eGFR has been calculated using the CKD EPI equation. This calculation has not been validated in all clinical situations. eGFR's persistently <60 mL/min signify possible Chronic Kidney Disease.    Anion gap 6 5 - 15  Urinalysis complete, with microscopic ([facility] only)     Status: Abnormal   Collection Time: 02/06/16  7:39 AM  Result Value Ref Range   Color, Urine AMBER (A) YELLOW   APPearance HAZY (A) CLEAR   Glucose, UA NEGATIVE NEGATIVE mg/dL   Bilirubin Urine NEGATIVE NEGATIVE   Ketones, ur TRACE (A) NEGATIVE mg/dL   Specific Gravity, Urine 1.027 1.005 - 1.030   Hgb urine dipstick NEGATIVE NEGATIVE   pH 5.0 5.0 - 8.0   Protein, ur 30  (A) NEGATIVE mg/dL   Nitrite NEGATIVE NEGATIVE   Leukocytes, UA NEGATIVE NEGATIVE   RBC / HPF NONE SEEN 0 - 5 RBC/hpf   WBC, UA 0-5 0 - 5 WBC/hpf   Bacteria, UA NONE SEEN NONE SEEN   Squamous Epithelial / LPF 0-5 (A) NONE SEEN   Mucous PRESENT   Urine Drug Screen, Qualitative ([facility] only)     Status: Abnormal   Collection Time: 02/06/16  7:39 AM  Result Value Ref Range   Tricyclic, Ur Screen NONE DETECTED NONE DETECTED   Amphetamines, Ur Screen NONE DETECTED NONE DETECTED   MDMA (Ecstasy)Ur Screen NONE DETECTED NONE DETECTED   Cocaine Metabolite,Ur [place] POSITIVE (A) NONE DETECTED   Opiate, Ur Screen NONE DETECTED NONE DETECTED   Phencyclidine (PCP) Ur S NONE DETECTED NONE DETECTED   Cannabinoid 50 Ng, Ur [place] POSITIVE (A) NONE DETECTED   Barbiturates, Ur Screen NONE DETECTED NONE DETECTED   Benzodiazepine, Ur Scrn NONE DETECTED NONE DETECTED   Methadone Scn, Ur NONE DETECTED NONE DETECTED    Comment: (NOTE) 800  Tricyclics, urine               Cutoff 1000 ng/mL 200  Amphetamines, urine             Cutoff 1000 ng/mL 300  MDMA (Ecstasy), urine           Cutoff 500 ng/mL 400  Cocaine Metabolite, urine       Cutoff 300 ng/mL 500  Opiate, urine                   Cutoff 300 ng/mL 600  Phencyclidine (PCP), urine      Cutoff 25 ng/mL 700  Cannabinoid, urine              Cutoff 50 ng/mL 800  Barbiturates, urine             Cutoff 200 ng/mL 900  Benzodiazepine, urine  Cutoff 200 ng/mL 1000 Methadone, urine                Cutoff 300 ng/mL 1100 1200 The urine drug screen provides only a preliminary, unconfirmed 1300 analytical test result and should not be used for non-medical 1400 purposes. Clinical consideration and professional judgment should 1500 be applied to any positive drug screen result due to possible 1600 interfering substances. A more specific alternate chemical method 1700 must be used in order to obtain a confirmed analytical result.  1800 Gas chromato graphy / mass  spectrometry (GC/MS) is the preferred 1900 confirmatory method.   Ethanol     Status: None   Collection Time: 02/06/16  7:39 AM  Result Value Ref Range   Alcohol, Ethyl (B) <5 <5 mg/dL    Comment:        LOWEST DETECTABLE LIMIT FOR SERUM ALCOHOL IS 5 mg/dL FOR MEDICAL PURPOSES ONLY     No current facility-administered medications for this encounter.   Current Outpatient Prescriptions  Medication Sig Dispense Refill  . Acetaminophen-Caff-Pyrilamine (MIDOL COMPLETE) 500-60-15 MG TABS Take 1 tablet by mouth 2 (two) times daily as needed.    . divalproex (DEPAKOTE) 500 MG DR tablet Take 1 tablet (500 mg total) by mouth 2 (two) times daily. 24 tablet 0  . fluticasone (FLONASE) 50 MCG/ACT nasal spray Place 1 spray into both nostrils daily. 16 g 0  . gabapentin (NEURONTIN) 100 MG capsule Take 1 capsule (100 mg total) by mouth 3 (three) times daily. 60 capsule 0  . metroNIDAZOLE (FLAGYL) 500 MG tablet Take 1 tablet (500 mg total) by mouth 2 (two) times daily. 14 tablet 0    Musculoskeletal: Strength & Muscle Tone: decreased Gait & Station: unsteady Patient leans: N/A  Psychiatric Specialty Exam: Review of Systems  Constitutional: Positive for weight loss and malaise/fatigue.  HENT: Negative.   Eyes: Negative.   Respiratory: Negative.   Cardiovascular: Negative.   Gastrointestinal: Negative.   Musculoskeletal: Positive for myalgias.  Skin: Negative.   Neurological: Positive for weakness.  Psychiatric/Behavioral: Positive for depression, memory loss and substance abuse. Negative for suicidal ideas and hallucinations. The patient is nervous/anxious and has insomnia.     Blood pressure 141/99, pulse 110, temperature 97.7 F (36.5 C), temperature source Oral, resp. rate 20, height 5' 3"  (1.6 m), weight 58.832 kg (129 lb 11.2 oz), SpO2 99 %.Body mass index is 22.98 kg/(m^2).  General Appearance: Disheveled  Eye Contact::  Minimal  Speech:  Slow and Slurred  Volume:  Decreased   Mood:  Dysphoric  Affect:  Constricted  Thought Process:  Goal Directed  Orientation:  Full (Time, Place, and Person)  Thought Content:  Negative  Suicidal Thoughts:  No  Homicidal Thoughts:  No  Memory:  Immediate;   Good Recent;   Fair Remote;   Fair  Judgement:  Fair  Insight:  Fair  Psychomotor Activity:  Decreased  Concentration:  Fair  Recall:  [employer] of Knowledge:Fair  Language: Fair  Akathisia:  No  Handed:  Right  AIMS (if indicated):     Assets:  Communication Skills Desire for Improvement Physical Health Resilience  ADL's:  Intact  Cognition: WNL  Sleep:      Treatment Plan Summary: Plan 30 year old woman brought to the emergency room after being found intoxicated in public with poor self-care. Heavy cocaine use and poor self-care for several days. On interview this morning she is denying any suicidal ideation. She denies homicidal ideation. She does not appear to  be having any psychotic symptoms. She is able to state appropriate insight about her substance abuse. Patient does not meet commitment criteria and will be taken off of IVC. At the same time I note that she is very tired and worn out seems to be a key and sluggish. I have talked with the patient and she feels like if she could go to some kind of "detox" even if it was very brief that would help her to have a better chance of staying stable. She will be taken off IVC and I have asked TTS to check if there is an observation bed at [facility] but would be available for her. If that is available we can try referral there. If it is not available we can look into our TS although I'm not sure she will be eligible. If none of that comes through we will reassess it in, with the next plan but an observation bed would work best. Patient is in agreement with this. Plan therefore otherwise pending no need for any specific medication at this point  Disposition: Patient does not meet criteria for psychiatric inpatient  admission. Supportive therapy provided about ongoing stressors.  Alethia Berthold, MD 02/06/2016 12:47 PM

## 2016-02-06 NOTE — ED Notes (Signed)
Report given to Ruthie RN    Pt to transfer at this time

## 2016-02-06 NOTE — ED Notes (Signed)
[facility]
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
## 2016-02-06 NOTE — ED Notes (Signed)
Pt returned from xray
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
## 2016-02-06 NOTE — ED Notes (Signed)
Breakfast provided  - she is sitting up in bed eating  NAD observed

## 2016-02-06 NOTE — ED Notes (Signed)
Pt changed into scrubs, labs drawn and urine collected. Pt belongings placed in labeled bag and placed next to quad RN. Belongings included pants, shirt, underwear, flip flops, hair band and one ring that is placed in sealed cup and placed in bag. PP .

## 2016-02-07 DIAGNOSIS — F1494 Cocaine use, unspecified with cocaine-induced mood disorder: Secondary | ICD-10-CM

## 2016-02-07 MED ORDER — HYDROXYZINE HCL 25 MG PO TABS: 25.0000 mg | ORAL_TABLET | Freq: Four times a day (QID) | ORAL | Status: DC | PRN

## 2016-02-07 MED ORDER — DIVALPROEX SODIUM ER 250 MG PO TB24
250.0000 mg | ORAL_TABLET | Freq: Every day | ORAL | Status: DC
  Filled 2016-02-07: qty 1

## 2016-02-07 MED ORDER — DIVALPROEX SODIUM 500 MG PO DR TAB: 500.0000 mg | DELAYED_RELEASE_TABLET | Freq: Two times a day (BID) | ORAL | Status: DC

## 2016-02-07 MED ORDER — TRAZODONE HCL 50 MG PO TABS: 50.0000 mg | ORAL_TABLET | Freq: Every evening | ORAL | Status: DC | PRN

## 2016-02-07 MED ORDER — FLUOXETINE HCL 20 MG PO CAPS: 20.0000 mg | ORAL_CAPSULE | Freq: Every day | ORAL | Status: DC

## 2016-02-07 MED ORDER — FLUOXETINE HCL 20 MG PO CAPS
20.0000 mg | ORAL_CAPSULE | Freq: Every day | ORAL | Status: DC
  Administered 2016-02-07: 20 mg via ORAL
  Filled 2016-02-07: qty 1
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
## 2016-02-07 NOTE — Progress Notes (Signed)
Foye ClockKristina was discharged to lobby per provider order. AVS, scripts, and belongings given. All materials reviewed and signed for when relevant. Pt verbalized understanding and readiness for discharge. Pt appeared euthymic and in NAD.

## 2016-02-07 NOTE — Discharge Summary (Signed)
[place] OBS UNIT DISCHARGE SUMMARY (seen once due to short LOS)  Patient Identification: Rebecca Mcclure MRN:  [id number] Principal Diagnosis: Cocaine-induced mood disorder [facility]) Diagnosis:   Patient Active Problem List   Diagnosis Date Noted  . Cocaine-induced mood disorder ([place]) [F14.94] 02/06/2016    Priority: High  . Substance induced mood disorder ([place]) [F19.94] 02/06/2016  . Cocaine abuse [F14.10] 02/06/2016  . Marijuana abuse [F12.10] 02/06/2016  . Noncompliance [Z91.19] 02/06/2016   Total Time spent with patient: 45 minutes  Discharge update: Pt ready to discharge with resources, no change in condition since this morning; only seen once due to short LOS  Subjective:   Rebecca Mcclure is a 30 y.o. female patient admitted with reports of cocaine abuse and being found in her car asleep at the gas pump by local law enforcement. Pt seen and chart reviewed. Pt is alert/oriented x4, calm, cooperative, and appropriate to situation. Pt denies suicidal/homicidal ideation and psychosis and does not appear to be responding to internal stimuli. Pt reports that she feels much better now and would like outpatient resources.   HPI:  I have reviewed and concur with HPI elements from my colleague, Dr. Weber Cooks, modified as below:  Patient interviewed. Chart reviewed including previous psychiatric and medical interactions. Labs and vitals reviewed. Case reviewed with TTS and emergency room physician. 30 year old woman was picked up by police who found her sitting in a car stopped at a gas station passed out. She told them that she needed help and invited them apparently according to her to search her car and they found her crack pipe. She says she is very grateful that rather than arresting her they brought her here to the hospital. She tells me that she has been smoking crack heavily for about 6 days now. She feels like she hasn't slept at all in 6 days and has not been eating much of anything. She thinks  she's lost more than 10 pounds in the last week. Her mood has been irritable and down but she denies having any thoughts or wishes to kill her self. She says she can't remember chunks of the last few days when she's been getting high but she denies any psychotic symptoms. She is not currently taking any psychiatric medicine or involved in any active mental health treatment. Patient says she had been staying clean for about 2 years prior to her relapse. At this point she can't think of what letter back to this particular relapse.  Social history: Patient is married lives with her husband in [place]. She hasn't spoken to him in days and says that she's not even sure if he will take her back right now. 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 Does not have any children.  Medical history: Patient has a past history of being treated for severe migraines but tells me that she doesn't feel like she has any active medical problems right now. Not on any prescription medicine currently.  Substance abuse history: Long-standing problem with cocaine as a primary drug of abuse. She tells me that she is been clean for a couple years and that she was going to a program that was church based. She "graduated" about 6 months ago and hasn't been any in any treatment since then  Past Psychiatric History: Patient has been diagnosed in the past with bipolar disorder. She tells me that in her opinion all of her mood symptoms of been drug related. She says all of the periods  of agitation and mood instability that she knows of of been related to drugs. She denies being aware of having any actual psychotic symptoms. She does have a past history of self-mutilation and has done a lot of cutting including some cutting in the last few months but denies ever seriously trying to kill her self. She had been prescribed Depakote and Celexa and was going to [facility] but stopped taking them last summer and didn't feel like it made  any difference to her mood. Does have prior hospital treatments  Pt spent the night in the [place] without incident and has since sobered up from her drug intoxication, asking to go home.   Risk to Self: Is patient at risk for suicide?: No Risk to Others:   Prior Inpatient Therapy:   Prior Outpatient Therapy:    Past Medical History:  Past Medical History  Diagnosis Date  . Asthma   . Bipolar 1 disorder [facility])     Past Surgical History  Procedure Laterality Date  . Ganglion cyst excision     Family History: History reviewed. No pertinent family history. Family Psychiatric  History: Patient says there is substance abuse positive in her family but she doesn't know of any other mental health problems in the family  Social History: drug use History  Alcohol Use No     History  Drug Use  . Yes  . Special: Cocaine, Marijuana    Comment: crack, cocaine quit 90 days ago; in half-way house    Social History   Social History  . Marital Status: Married    Spouse Name: N/A  . Number of Children: N/A  . Years of Education: N/A   Social History Main Topics  . Smoking status: Current Every Day Smoker -- 1.00 packs/day    Types: Cigarettes  . Smokeless tobacco: None  . Alcohol Use: No  . Drug Use: Yes    Special: Cocaine, Marijuana     Comment: crack, cocaine quit 90 days ago; in half-way house  . Sexual Activity: Yes    Birth Control/ Protection: None   Other Topics Concern  . None   Social History Narrative   Additional Social History:    Allergies:  No Known Allergies  Labs:  Results for orders placed or performed during the hospital encounter of 02/06/16 (from the past 48 hour(s))  CBC     Status: Abnormal   Collection Time: 02/06/16  7:39 AM  Result Value Ref Range   WBC 18.8 (H) 3.6 - 11.0 K/uL   RBC 4.38 3.80 - 5.20 MIL/uL   Hemoglobin 13.6 12.0 - 16.0 g/dL   HCT 41.5 35.0 - 47.0 %   MCV 94.8 80.0 - 100.0 fL   MCH 31.1 26.0 - 34.0 pg   MCHC 32.8 32.0  - 36.0 g/dL   RDW 14.3 11.5 - 14.5 %   Platelets 337 150 - 440 K/uL  Comprehensive metabolic panel     Status: Abnormal   Collection Time: 02/06/16  7:39 AM  Result Value Ref Range   Sodium 139 135 - 145 mmol/L   Potassium 4.0 3.5 - 5.1 mmol/L   Chloride 106 101 - 111 mmol/L   CO2 27 22 - 32 mmol/L   Glucose, Bld 105 (H) 65 - 99 mg/dL   BUN 19 6 - 20 mg/dL   Creatinine, Ser 0.85 0.44 - 1.00 mg/dL   Calcium 9.7 8.9 - 10.3 mg/dL   Total Protein 8.1 6.5 - 8.1 g/dL  Albumin 4.7 3.5 - 5.0 g/dL   AST 54 (H) 15 - 41 U/L   ALT 56 (H) 14 - 54 U/L   Alkaline Phosphatase 53 38 - 126 U/L   Total Bilirubin 1.0 0.3 - 1.2 mg/dL   GFR calc non Af Amer >60 >60 mL/min   GFR calc Af Amer >60 >60 mL/min    Comment: (NOTE) The eGFR has been calculated using the CKD EPI equation. This calculation has not been validated in all clinical situations. eGFR's persistently <60 mL/min signify possible Chronic Kidney Disease.    Anion gap 6 5 - 15  Urinalysis complete, with microscopic ([facility] only)     Status: Abnormal   Collection Time: 02/06/16  7:39 AM  Result Value Ref Range   Color, Urine AMBER (A) YELLOW   APPearance HAZY (A) CLEAR   Glucose, UA NEGATIVE NEGATIVE mg/dL   Bilirubin Urine NEGATIVE NEGATIVE   Ketones, ur TRACE (A) NEGATIVE mg/dL   Specific Gravity, Urine 1.027 1.005 - 1.030   Hgb urine dipstick NEGATIVE NEGATIVE   pH 5.0 5.0 - 8.0   Protein, ur 30 (A) NEGATIVE mg/dL   Nitrite NEGATIVE NEGATIVE   Leukocytes, UA NEGATIVE NEGATIVE   RBC / HPF NONE SEEN 0 - 5 RBC/hpf   WBC, UA 0-5 0 - 5 WBC/hpf   Bacteria, UA NONE SEEN NONE SEEN   Squamous Epithelial / LPF 0-5 (A) NONE SEEN   Mucous PRESENT   Urine Drug Screen, Qualitative ([facility] only)     Status: Abnormal   Collection Time: 02/06/16  7:39 AM  Result Value Ref Range   Tricyclic, Ur Screen NONE DETECTED NONE DETECTED   Amphetamines, Ur Screen NONE DETECTED NONE DETECTED   MDMA (Ecstasy)Ur Screen NONE DETECTED NONE DETECTED    Cocaine Metabolite,Ur [place] POSITIVE (A) NONE DETECTED   Opiate, Ur Screen NONE DETECTED NONE DETECTED   Phencyclidine (PCP) Ur S NONE DETECTED NONE DETECTED   Cannabinoid 50 Ng, Ur [place] POSITIVE (A) NONE DETECTED   Barbiturates, Ur Screen NONE DETECTED NONE DETECTED   Benzodiazepine, Ur Scrn NONE DETECTED NONE DETECTED   Methadone Scn, Ur NONE DETECTED NONE DETECTED    Comment: (NOTE) 540  Tricyclics, urine               Cutoff 1000 ng/mL 200  Amphetamines, urine             Cutoff 1000 ng/mL 300  MDMA (Ecstasy), urine           Cutoff 500 ng/mL 400  Cocaine Metabolite, urine       Cutoff 300 ng/mL 500  Opiate, urine                   Cutoff 300 ng/mL 600  Phencyclidine (PCP), urine      Cutoff 25 ng/mL 700  Cannabinoid, urine              Cutoff 50 ng/mL 800  Barbiturates, urine             Cutoff 200 ng/mL 900  Benzodiazepine, urine           Cutoff 200 ng/mL 1000 Methadone, urine                Cutoff 300 ng/mL 1100 1200 The urine drug screen provides only a preliminary, unconfirmed 1300 analytical test result and should not be used for non-medical 1400 purposes. Clinical consideration and professional judgment should 1500 be applied to any positive drug screen result  due to possible 1600 interfering substances. A more specific alternate chemical method 1700 must be used in order to obtain a confirmed analytical result.  1800 Gas chromato graphy / mass spectrometry (GC/MS) is the preferred 1900 confirmatory method.   Ethanol     Status: None   Collection Time: 02/06/16  7:39 AM  Result Value Ref Range   Alcohol, Ethyl (B) <5 <5 mg/dL    Comment:        LOWEST DETECTABLE LIMIT FOR SERUM ALCOHOL IS 5 mg/dL FOR MEDICAL PURPOSES ONLY     Current Facility-Administered Medications  Medication Dose Route Frequency Provider Last Rate Last Dose  . acetaminophen (TYLENOL) tablet 650 mg  650 mg Oral Q6H PRN Niel Hummer, NP      . alum & mag hydroxide-simeth (MAALOX/MYLANTA)  200-200-20 MG/5ML suspension 30 mL  30 mL Oral Q4H PRN Niel Hummer, NP      . divalproex (DEPAKOTE ER) 24 hr tablet 250 mg  250 mg Oral Daily Laverle Hobby, PA-C   250 mg at 02/07/16 0819  . FLUoxetine (PROZAC) capsule 20 mg  20 mg Oral Daily Laverle Hobby, PA-C   20 mg at 02/07/16 4917  . hydrOXYzine (ATARAX/VISTARIL) tablet 25 mg  25 mg Oral Q6H PRN Niel Hummer, NP      . ibuprofen (ADVIL,MOTRIN) tablet 600 mg  600 mg Oral Q6H PRN Niel Hummer, NP      . magnesium hydroxide (MILK OF MAGNESIA) suspension 30 mL  30 mL Oral Daily PRN Niel Hummer, NP      . traZODone (DESYREL) tablet 50 mg  50 mg Oral QHS PRN Niel Hummer, NP        Musculoskeletal: Strength & Muscle Tone: within normal limits Gait & Station: normal Patient leans: N/A  Psychiatric Specialty Exam: Review of Systems  HENT: Negative.   Eyes: Negative.   Respiratory: Negative.   Cardiovascular: Negative.   Gastrointestinal: Negative.   Skin: Negative.   Psychiatric/Behavioral: Positive for depression and substance abuse. Negative for suicidal ideas and hallucinations. The patient is nervous/anxious and has insomnia.   All other systems reviewed and are negative.   Blood pressure 99/57, pulse 68, temperature 98.5 F (36.9 C), temperature source Oral, resp. rate 18, height 5' 3"  (1.6 m), weight 58.06 kg (128 lb), last menstrual period 01/07/2016, SpO2 98 %.Body mass index is 22.68 kg/(m^2).  General Appearance: Casual and Fairly Groomed  Engineer, water::  Good  Speech:  Clear and Coherent and Normal Rate  Volume:  Normal  Mood:  Anxious and Depressed  Affect:  Appropriate and Congruent  Thought Process:  Goal Directed  Orientation:  Full (Time, Place, and Person)  Thought Content:  Symptoms, worries, concerns  Suicidal Thoughts:  No  Homicidal Thoughts:  No  Memory:  Immediate;   Good Recent;   Fair Remote;   Fair  Judgement:  Fair  Insight:  Fair  Psychomotor Activity:  Normal  Concentration:  Fair   Recall:  [employer] of Knowledge:Fair  Language: Fair  Akathisia:  No  Handed:  Right  AIMS (if indicated):     Assets:  Communication Skills Desire for Improvement Physical Health Resilience  ADL's:  Intact  Cognition: WNL  Sleep:      Treatment Plan Summary: Cocaine-induced mood disorder ([place]), improving, stable for outpatient management.  Disposition:  -Discharge home back to [facility] resources given for psychiatry/counseling/substance abuse  Benjamine Mola, [place] 02/07/2016 2:04 PM  Agree with NP Note as above

## 2016-02-07 NOTE — Progress Notes (Signed)
Nursing Shift Assessment  Patient is alert and oriented, once Nurse awakening her for her scheduled medications, denies any SI/HI/AVH, rates Depression currently rates at an 8 on a 1 to 10 scale and rates anxiety at level 6 on 1 to 10 scale; states she is mostly angry at herself for falling off the wagon after 2 years. Patient refusing to take Depakote as ordered, stating "I'm not Bipolar, I don't need that". Patient did take her Prozac 20mg  as ordered. Patient stating she wants Outpatient services as opposed to any sort of Inpatient treatment and that she is "ready to go home". Nurse administering medications as ordered (except for the Depakote patient refusing), provinding emotional support, therapeutic communication and ensuring patient is observed at all times except when in the bathroom. Patient remains safe on Unit.

## 2016-11-05 ENCOUNTER — Emergency Department
Admission: EM | Admit: 2016-11-05 | Discharge: 2016-11-05 | Disposition: A | Discharge status: Home / Self Care | Attending: Emergency Medicine | Admitting: Emergency Medicine

## 2016-11-05 ENCOUNTER — Encounter: Payer: Self-pay | Admitting: Emergency Medicine

## 2016-11-05 DIAGNOSIS — F1994 Other psychoactive substance use, unspecified with psychoactive substance-induced mood disorder: Secondary | ICD-10-CM

## 2016-11-05 DIAGNOSIS — F17210 Nicotine dependence, cigarettes, uncomplicated: Secondary | ICD-10-CM | POA: Diagnosis not present

## 2016-11-05 DIAGNOSIS — J45909 Unspecified asthma, uncomplicated: Secondary | ICD-10-CM | POA: Insufficient documentation

## 2016-11-05 DIAGNOSIS — Z5181 Encounter for therapeutic drug level monitoring: Secondary | ICD-10-CM | POA: Diagnosis not present

## 2016-11-05 DIAGNOSIS — F1910 Other psychoactive substance abuse, uncomplicated: Secondary | ICD-10-CM | POA: Diagnosis not present

## 2016-11-05 DIAGNOSIS — Z046 Encounter for general psychiatric examination, requested by authority: Secondary | ICD-10-CM | POA: Diagnosis present

## 2016-11-05 DIAGNOSIS — F1410 Cocaine abuse, uncomplicated: Secondary | ICD-10-CM

## 2016-11-05 DIAGNOSIS — R45851 Suicidal ideations: Secondary | ICD-10-CM

## 2016-11-05 HISTORY — DX: Other psychoactive substance dependence, uncomplicated: F19.20

## 2016-11-05 LAB — COMPREHENSIVE METABOLIC PANEL
ALBUMIN: 4.4 g/dL (ref 3.5–5.0)
ALK PHOS: 54 U/L (ref 38–126)
ALT: 112 U/L — ABNORMAL HIGH (ref 14–54)
ANION GAP: 8 (ref 5–15)
AST: 104 U/L — AB (ref 15–41)
BILIRUBIN TOTAL: 0.4 mg/dL (ref 0.3–1.2)
BUN: 25 mg/dL — ABNORMAL HIGH (ref 6–20)
CALCIUM: 9.1 mg/dL (ref 8.9–10.3)
CO2: 27 mmol/L (ref 22–32)
Chloride: 104 mmol/L (ref 101–111)
Creatinine, Ser: 0.85 mg/dL (ref 0.44–1.00)
GFR calc Af Amer: >60 mL/min (ref >60)
GFR calc non Af Amer: >60 mL/min (ref >60)
Glucose, Bld: 128 mg/dL — ABNORMAL HIGH (ref 65–99)
Potassium: 3.7 mmol/L (ref 3.5–5.1)
SODIUM: 139 mmol/L (ref 135–145)
Total Protein: 7.7 g/dL (ref 6.5–8.1)

## 2016-11-05 LAB — CBC
HCT: 36.5 % (ref 35.0–47.0)
Hemoglobin: 12.6 g/dL (ref 12.0–16.0)
MCH: 33.1 pg (ref 26.0–34.0)
MCHC: 34.6 g/dL (ref 32.0–36.0)
MCV: 95.6 fL (ref 80.0–100.0)
PLATELETS: 342 10*3/uL (ref 150–440)
RBC: 3.82 MIL/uL (ref 3.80–5.20)
RDW: 13.3 % (ref 11.5–14.5)
WBC: 13.9 10*3/uL — ABNORMAL HIGH (ref 3.6–11.0)

## 2016-11-05 LAB — URINALYSIS, COMPLETE (UACMP) WITH MICROSCOPIC
BILIRUBIN URINE: NEGATIVE
Glucose, UA: NEGATIVE mg/dL
KETONES UR: 5 mg/dL — AB
LEUKOCYTES UA: NEGATIVE
Nitrite: NEGATIVE
Protein, ur: 30 mg/dL — AB
Specific Gravity, Urine: 1.025 (ref 1.005–1.030)
pH: 5 (ref 5.0–8.0)

## 2016-11-05 LAB — URINE DRUG SCREEN, QUALITATIVE (ARMC ONLY)
Amphetamines, Ur Screen: NOT DETECTED
BARBITURATES, UR SCREEN: NOT DETECTED
Benzodiazepine, Ur Scrn: POSITIVE — AB
CANNABINOID 50 NG, UR ~~LOC~~: POSITIVE — AB
COCAINE METABOLITE, UR ~~LOC~~: POSITIVE — AB
MDMA (ECSTASY) UR SCREEN: NOT DETECTED
Methadone Scn, Ur: NOT DETECTED
Opiate, Ur Screen: NOT DETECTED
PHENCYCLIDINE (PCP) UR S: NOT DETECTED
Tricyclic, Ur Screen: NOT DETECTED

## 2016-11-05 LAB — ETHANOL: Alcohol, Ethyl (B): <5 mg/dL (ref <5)

## 2016-11-05 LAB — MAGNESIUM: MAGNESIUM: 2.4 mg/dL (ref 1.7–2.4)

## 2016-11-05 LAB — SALICYLATE LEVEL: Salicylate Lvl: <7 mg/dL (ref 2.8–30.0)

## 2016-11-05 LAB — POCT PREGNANCY, URINE: PREG TEST UR: NEGATIVE

## 2016-11-05 LAB — ACETAMINOPHEN LEVEL

## 2016-11-05 MED ORDER — LORAZEPAM 2 MG PO TABS
ORAL_TABLET | ORAL | Status: AC
  Administered 2016-11-05: 2 mg via ORAL
  Filled 2016-11-05: qty 1

## 2016-11-05 MED ORDER — LORAZEPAM 1 MG PO TABS: 1.0000 mg | ORAL_TABLET | Freq: Once | ORAL | Status: DC

## 2016-11-05 MED ORDER — FOSFOMYCIN TROMETHAMINE 3 G PO PACK
3.0000 g | PACK | Freq: Once | ORAL | Status: AC
  Administered 2016-11-05: 3 g via ORAL
  Filled 2016-11-05: qty 3

## 2016-11-05 MED ORDER — LORAZEPAM 2 MG PO TABS
2.0000 mg | ORAL_TABLET | Freq: Once | ORAL | Status: AC
  Administered 2016-11-05: 2 mg via ORAL

## 2016-11-05 NOTE — ED Notes (Signed)
Pt talked with TTS with tech having to wake pt for each question. Pt getting irritated because we won't let her sleep. TTS finished and pt is sleeping. Lunch tray placed at pt bedside.

## 2016-11-05 NOTE — ED Notes (Signed)
Patient discharged to home per MD order. Patient in stable condition, and deemed medically cleared by ED provider for discharge. Discharge instructions reviewed with patient/family using "Teach Back"; verbalized understanding of medication education and administration, and information about follow-up care. Denies further concerns and discharged to father.

## 2016-11-05 NOTE — ED Notes (Signed)
Pt ambulated to bathroom and gave urine sample. Pt heard snoring while in bathroom and pt was slumped forward and sleeping on the toilet. Pt returned to bed and placed back on monitor. Pt given warm blanket.

## 2016-11-05 NOTE — BH Assessment (Signed)
TTS consult attempted. Pt reports cocaine use. Pt presenting with pressured and mostly inaudable  speech. Pt is hyperactive and restless with erratic movements and psychomotor agitation.  Pt unable to participate in assessment due to presentation. TTS to assess at later time.

## 2016-11-05 NOTE — BH Assessment (Signed)
This clinician attempted to see the patient but staff reports the patient is still sleeping. Gave my desk number [phone] and cell [phone] for call back when the patient wakes.

## 2016-11-05 NOTE — ED Notes (Signed)
PT IVC PENDING PSYCH CONSULT. 

## 2016-11-05 NOTE — Discharge Instructions (Signed)
You have been seen in the emergency department for a  psychiatric concern. You have been evaluated both medically as well as psychiatrically. Please follow-up with your outpatient resources provided. Return to the emergency department for any worsening symptoms, or any thoughts of hurting yourself or anyone else so that we may attempt to help you. 

## 2016-11-05 NOTE — BH Assessment (Signed)
Tele Assessment Note   Rebecca BloomKristina H Mcclure is an 31 y.o. female who presents to [facility] after being found passed out behind a vehicle in a parking lot. Patient remains very drowsy but was able to converse with this clinician. She reports a substance abuse history, primarily crack cocaine use. First, use at age 31 yr old. Patient denies SI, HI or A/V at present. Admits to previous suicide attempt as a teenage.   Denies inpatient psychiatric admission but attended a spiritually based substance abuse program called [facility] in [place], [place]. The patient denied criminal charges or probation but presents with [place] [employer].   The patient lives with her parents in [place]. Denies having any children. Urine pregnancy was negative. UDS positive for cocaine, cannabis, benzodiazepine. <5 alcohol.   Dr. Toni Amendlapacs to evaluate patient.   Diagnosis: Cocaine use disorder  Past Medical History:  Past Medical History:  Diagnosis Date  . Drug addiction [facility](HCC)     Past Surgical History:  Procedure Laterality Date  . GANGLION CYST EXCISION    . NOSE SURGERY      Family History: No family history on file.  Social History:  reports that she has been smoking Cigarettes.  She has been smoking about 1.00 pack per day. She has never used smokeless tobacco. She reports that she uses drugs, including Cocaine. She reports that she does not drink alcohol.  Additional Social History:  Alcohol / Drug Use Pain Medications: see MAR Prescriptions: see MAR Over the Counter: see MAR History of alcohol / drug use?: Yes Substance #1 Name of Substance 1: crack cocaine 1 - Age of First Use: 31 yrs old 1 - Last Use / Amount: today  CIWA: CIWA-Ar BP: 97/64 Pulse Rate: 70 Nausea and Vomiting: no nausea and no vomiting Tactile Disturbances: none Tremor: no tremor Auditory Disturbances: not present Paroxysmal Sweats: no sweat visible Visual Disturbances: not  present Anxiety: no anxiety, at ease Headache, Fullness in Head: none present Agitation: normal activity Orientation and Clouding of Sensorium: cannot do serial additions or is uncertain about date CIWA-Ar Total: 1 COWS:    PATIENT STRENGTHS: (choose at least two) Average or above average intelligence General fund of knowledge  Allergies: No Known Allergies  Home Medications:  (Not in a hospital admission)  OB/GYN Status:  No LMP recorded.  General Assessment Data Location of Assessment[facility] ED TTS Assessment: In system Is this a Tele or Face-to-Face Assessment?: Tele Assessment Is this an Initial Assessment or a Re-assessment for this encounter?: Initial Assessment Marital status: Single Maiden name: Barry DienesOwens Is patient pregnant?: No Pregnancy Status: No Living Arrangements: Parent Can pt return to current living arrangement?: Yes Admission Status: Involuntary Is patient capable of signing voluntary admission?: Yes Referral Source: Self/Family/Friend Insurance type: [place] [employer] public/safety inmates  Medical Screening Exam [facility] Walk-in ONLY) Medical Exam completed: Yes  Crisis Care Plan Living Arrangements: Parent Name of Psychiatrist: n/a Name of Therapist: n/a  Education Status Is patient currently in school?: No  Risk to self with the past 6 months Suicidal Ideation: No Has patient been a risk to self within the past 6 months prior to admission? : No Suicidal Intent: No Has patient had any suicidal intent within the past 6 months prior to admission? : No Is patient at risk for suicide?: No Suicidal Plan?: No Has patient had any suicidal plan within the past 6 months prior to admission? : No Access to Means: No What has been your use  of drugs/alcohol within the last 12 months?: cocaine Previous Attempts/Gestures: Yes How many times?: 1 Other Self Harm Risks: n/a Triggers for Past Attempts: Unknown Intentional Self Injurious Behavior: None Family Suicide  History: Unknown Persecutory voices/beliefs?: No Depression: No Substance abuse history and/or treatment for substance abuse?: Yes Suicide prevention information given to non-admitted patients: Not applicable  Risk to Others within the past 6 months Homicidal Ideation: No Does patient have any lifetime risk of violence toward others beyond the six months prior to admission? : No Thoughts of Harm to Others: No Current Homicidal Intent: No Current Homicidal Plan: No Access to Homicidal Means: No History of harm to others?: No Assessment of Violence: None Noted Does patient have access to weapons?: No Criminal Charges Pending?: No Does patient have a court date: No Is patient on probation?: No  Psychosis Hallucinations: None noted Delusions: None noted  Mental Status Report Appearance/Hygiene: Disheveled, In hospital gown Eye Contact: Poor Motor Activity: Psychomotor retardation Level of Consciousness: Drowsy Mood: Irritable Affect: Appropriate to circumstance Anxiety Level: None Thought Processes: Relevant Judgement: Unimpaired Orientation: Person, Place Obsessive Compulsive Thoughts/Behaviors: Unable to Assess  Cognitive Functioning Concentration: Decreased Memory: Recent Intact, Remote Intact IQ: Average Insight: Poor Impulse Control: Poor  ADLScreening [facility] Assessment Services) Patient's cognitive ability adequate to safely complete daily activities?: Yes Patient able to express need for assistance with ADLs?: Yes Independently performs ADLs?: Yes (appropriate for developmental age)  Prior Inpatient Therapy Prior Inpatient Therapy: Yes Prior Therapy Facilty/Provider(s): [employer] Reason for Treatment: spiritually based  drug treatment program  Prior Outpatient Therapy Prior Outpatient Therapy: No Does patient have an ACCT team?: No Does patient have Intensive In-House Services?  : No Does patient have [facility] services? : No Does patient have P4CC  services?: No  ADL Screening (condition at time of admission) Patient's cognitive ability adequate to safely complete daily activities?: Yes Is the patient deaf or have difficulty hearing?: No Does the patient have difficulty seeing, even when wearing glasses/contacts?: No Does the patient have difficulty concentrating, remembering, or making decisions?: No Patient able to express need for assistance with ADLs?: Yes Does the patient have difficulty dressing or bathing?: No Independently performs ADLs?: Yes (appropriate for developmental age)             Merchant navy officer (For Healthcare) Does Patient Have a Medical Advance Directive?: No    Additional Information 1:1 In Past 12 Months?: No CIRT Risk: No Elopement Risk: No Does patient have medical clearance?: Yes     Disposition:  Disposition Initial Assessment Completed for this Encounter: Yes Disposition of Patient: Other dispositions (Dr. Toni Amend to see patient) Other disposition(s): Other (Comment)  Vonzell Schlatter [facility] 11/05/2016 12:26 PM

## 2016-11-05 NOTE — ED Notes (Signed)
Pt placed on cardiac monitor 

## 2016-11-05 NOTE — ED Triage Notes (Signed)
Pt arrived to ED via [employer] after she was found by a bystander passed out in a vehicle in [place] parking lot. Pt had been reported missing for the past 3 days. Pt restless and admits to using cocaine. Denies SI at this time but pt was heard by [place] pd saying that she hopes her heart will explode.

## 2016-11-05 NOTE — ED Notes (Signed)
Pt eating breakfast and drinking sprite.

## 2016-11-05 NOTE — ED Notes (Signed)
Breakfast tray sat at bedside. Pt sleeping. 

## 2016-11-05 NOTE — Consult Note (Signed)
[facility] Consult   Reason for Consult:  Consult for 31 year old woman who was brought to the emergency room last night after being found passed out in the parking lot Referring Physician:  McShane Patient Identification: Rebecca Mcclure MRN:  [id number] Principal Diagnosis: Cocaine abuse Diagnosis:   Patient Active Problem List   Diagnosis Date Noted  . Cocaine abuse [F14.10] 11/05/2016  . Substance induced mood disorder [facility]) [F19.94] 11/05/2016    Total Time spent with patient: 1 hour  Subjective:   Rebecca Mcclure is a 31 y.o. female patient admitted with "I got back into drug use".  HPI:  Patient interviewed. Chart reviewed. 31 year old woman brought in after being found passed out in the parking lot. Earlier today the patient was sedated for much of the day. She was noncooperative with some previous evaluations. By this afternoon she has woken up and has calm down and was cooperative with the interview. She tells me that she came back to [facility] a couple months ago and 30 rapidly got back into cocaine use. She's been using crack cocaine almost daily. Not eating well. Sleeping poorly. Probably hadn't been sleeping in several days before coming to the hospital. Has been losing weight. Mood feels depressed and angry and anxious. Denies any suicidal ideation or homicidal ideation. Not having psychotic symptoms. Not having any other specific medical issues. Denies that she's using any other drugs currently.  Social history: Parents live in [place]. Has been staying with them at times. Other times is out in the streets using crack cocaine. She has a female partner but they are currently separated and he is down in [place]. She said that she had been in a long-term substance abuse program with him in [place] until a couple months ago.  Medical history: No significant medical problems. Looks underweight but not necessarily unhealthy.  Substance abuse history: Long  history of cocaine abuse. Says that she had about 2 years of sobriety with only a little bit of interruption and was in a long-term program in [place]. She says that as soon as she comes back to [facility] she feels like she's triggered back into use.'s familiar with appropriate substance abuse treatment methods  Past Psychiatric History: She says she was diagnosed with attention deficit disorder when she was in [place] and treated with Adderall and that when she was taking Adderall she did not abuse any drugs. She realizes that it is difficult to find a provider who is willing to put her back on stimulants given her heavy cocaine abuse but she insists that it was very helpful for her and that she didn't abuse it. She did have a history of a suicide attempt years ago nothing more recently not on any psychiatric medicine currently  Risk to Self: Suicidal Ideation: No Suicidal Intent: No Is patient at risk for suicide?: No Suicidal Plan?: No Access to Means: No What has been your use of drugs/alcohol within the last 12 months?: cocaine How many times?: 1 Other Self Harm Risks: n/a Triggers for Past Attempts: Unknown Intentional Self Injurious Behavior: None Risk to Others: Homicidal Ideation: No Thoughts of Harm to Others: No Current Homicidal Intent: No Current Homicidal Plan: No Access to Homicidal Means: No History of harm to others?: No Assessment of Violence: None Noted Does patient have access to weapons?: No Criminal Charges Pending?: No Does patient have a court date: No Prior Inpatient Therapy: Prior Inpatient Therapy: Yes Prior Therapy Facilty/Provider(s): [facility] Reason for Treatment: spiritually based  drug treatment program Prior Outpatient Therapy: Prior Outpatient Therapy: No Does patient have an ACCT team?: No Does patient have Intensive In-House Services?  : No Does patient have [facility] services? : No Does patient have P4CC services?: No  Past Medical  History:  Past Medical History:  Diagnosis Date  . Drug addiction [facility])     Past Surgical History:  Procedure Laterality Date  . GANGLION CYST EXCISION    . NOSE SURGERY     Family History: No family history on file. Family Psychiatric  History: Family history positive for substance abuse and multiple members of the family and PTSD in one aunt Social History:  History  Alcohol Use No     History  Drug Use  . Types: Cocaine    Social History   Social History  . Marital status: Single    Spouse name: N/A  . Number of children: N/A  . Years of education: N/A   Social History Main Topics  . Smoking status: Current Every Day Smoker    Packs/day: 1.00    Types: Cigarettes  . Smokeless tobacco: Never Used  . Alcohol use No  . Drug use: Yes    Types: Cocaine  . Sexual activity: Not Asked   Other Topics Concern  . None   Social History Narrative  . None   Additional Social History:    Allergies:  No Known Allergies  Labs:  Results for orders placed or performed during the hospital encounter of 11/05/16 (from the past 48 hour(s))  Comprehensive metabolic panel     Status: Abnormal   Collection Time: 11/05/16  2:01 AM  Result Value Ref Range   Sodium 139 135 - 145 mmol/L   Potassium 3.7 3.5 - 5.1 mmol/L   Chloride 104 101 - 111 mmol/L   CO2 27 22 - 32 mmol/L   Glucose, Bld 128 (H) 65 - 99 mg/dL   BUN 25 (H) 6 - 20 mg/dL   Creatinine, Ser 0.85 0.44 - 1.00 mg/dL   Calcium 9.1 8.9 - 10.3 mg/dL   Total Protein 7.7 6.5 - 8.1 g/dL   Albumin 4.4 3.5 - 5.0 g/dL   AST 104 (H) 15 - 41 U/L   ALT 112 (H) 14 - 54 U/L   Alkaline Phosphatase 54 38 - 126 U/L   Total Bilirubin 0.4 0.3 - 1.2 mg/dL   GFR calc non Af Amer >60 >60 mL/min   GFR calc Af Amer >60 >60 mL/min    Comment: (NOTE) The eGFR has been calculated using the CKD EPI equation. This calculation has not been validated in all clinical situations. eGFR's persistently <60 mL/min signify possible Chronic  Kidney Disease.    Anion gap 8 5 - 15  Ethanol     Status: None   Collection Time: 11/05/16  2:01 AM  Result Value Ref Range   Alcohol, Ethyl (B) <5 <5 mg/dL    Comment:        LOWEST DETECTABLE LIMIT FOR SERUM ALCOHOL IS 5 mg/dL FOR MEDICAL PURPOSES ONLY   cbc     Status: Abnormal   Collection Time: 11/05/16  2:01 AM  Result Value Ref Range   WBC 13.9 (H) 3.6 - 11.0 K/uL   RBC 3.82 3.80 - 5.20 MIL/uL   Hemoglobin 12.6 12.0 - 16.0 g/dL   HCT 36.5 35.0 - 47.0 %   MCV 95.6 80.0 - 100.0 fL   MCH 33.1 26.0 - 34.0 pg   MCHC 34.6 32.0 - 36.0  g/dL   RDW 13.3 11.5 - 14.5 %   Platelets 342 150 - 440 K/uL  Acetaminophen level     Status: Abnormal   Collection Time: 11/05/16  2:01 AM  Result Value Ref Range   Acetaminophen (Tylenol), Serum <10 (L) 10 - 30 ug/mL    Comment:        THERAPEUTIC CONCENTRATIONS VARY SIGNIFICANTLY. A RANGE OF 10-30 ug/mL MAY BE AN EFFECTIVE CONCENTRATION FOR MANY PATIENTS. HOWEVER, SOME ARE BEST TREATED AT CONCENTRATIONS OUTSIDE THIS RANGE. ACETAMINOPHEN CONCENTRATIONS >150 ug/mL AT 4 HOURS AFTER INGESTION AND >50 ug/mL AT 12 HOURS AFTER INGESTION ARE OFTEN ASSOCIATED WITH TOXIC REACTIONS.   Salicylate level     Status: None   Collection Time: 11/05/16  2:01 AM  Result Value Ref Range   Salicylate Lvl <6.8 2.8 - 30.0 mg/dL  Magnesium     Status: None   Collection Time: 11/05/16  2:01 AM  Result Value Ref Range   Magnesium 2.4 1.7 - 2.4 mg/dL  Urine Drug Screen, Qualitative     Status: Abnormal   Collection Time: 11/05/16 11:25 AM  Result Value Ref Range   Tricyclic, Ur Screen NONE DETECTED NONE DETECTED   Amphetamines, Ur Screen NONE DETECTED NONE DETECTED   MDMA (Ecstasy)Ur Screen NONE DETECTED NONE DETECTED   Cocaine Metabolite,Ur [place] POSITIVE (A) NONE DETECTED   Opiate, Ur Screen NONE DETECTED NONE DETECTED   Phencyclidine (PCP) Ur S NONE DETECTED NONE DETECTED   Cannabinoid 50 Ng, Ur [place] POSITIVE (A) NONE DETECTED   Barbiturates, Ur  Screen NONE DETECTED NONE DETECTED   Benzodiazepine, Ur Scrn POSITIVE (A) NONE DETECTED   Methadone Scn, Ur NONE DETECTED NONE DETECTED    Comment: (NOTE) 341  Tricyclics, urine               Cutoff 1000 ng/mL 200  Amphetamines, urine             Cutoff 1000 ng/mL 300  MDMA (Ecstasy), urine           Cutoff 500 ng/mL 400  Cocaine Metabolite, urine       Cutoff 300 ng/mL 500  Opiate, urine                   Cutoff 300 ng/mL 600  Phencyclidine (PCP), urine      Cutoff 25 ng/mL 700  Cannabinoid, urine              Cutoff 50 ng/mL 800  Barbiturates, urine             Cutoff 200 ng/mL 900  Benzodiazepine, urine           Cutoff 200 ng/mL 1000 Methadone, urine                Cutoff 300 ng/mL 1100 1200 The urine drug screen provides only a preliminary, unconfirmed 1300 analytical test result and should not be used for non-medical 1400 purposes. Clinical consideration and professional judgment should 1500 be applied to any positive drug screen result due to possible 1600 interfering substances. A more specific alternate chemical method 1700 must be used in order to obtain a confirmed analytical result.  1800 Gas chromato graphy / mass spectrometry (GC/MS) is the preferred 1900 confirmatory method.   Urinalysis, Complete w Microscopic     Status: Abnormal   Collection Time: 11/05/16 11:25 AM  Result Value Ref Range   Color, Urine AMBER (A) YELLOW    Comment: BIOCHEMICALS MAY BE AFFECTED BY COLOR  APPearance CLOUDY (A) CLEAR   Specific Gravity, Urine 1.025 1.005 - 1.030   pH 5.0 5.0 - 8.0   Glucose, UA NEGATIVE NEGATIVE mg/dL   Hgb urine dipstick MODERATE (A) NEGATIVE   Bilirubin Urine NEGATIVE NEGATIVE   Ketones, ur 5 (A) NEGATIVE mg/dL   Protein, ur 30 (A) NEGATIVE mg/dL   Nitrite NEGATIVE NEGATIVE   Leukocytes, UA NEGATIVE NEGATIVE   RBC / HPF 0-5 0 - 5 RBC/hpf   WBC, UA 0-5 0 - 5 WBC/hpf   Bacteria, UA MANY (A) NONE SEEN   Squamous Epithelial / LPF 6-30 (A) NONE SEEN   Mucous  PRESENT   Pregnancy, urine POC     Status: None   Collection Time: 11/05/16 11:28 AM  Result Value Ref Range   Preg Test, Ur NEGATIVE NEGATIVE    Comment:        THE SENSITIVITY OF THIS METHODOLOGY IS >24 mIU/mL     No current facility-administered medications for this encounter.    No current outpatient prescriptions on file.    Musculoskeletal: Strength & Muscle Tone: within normal limits Gait & Station: normal Patient leans: N/A  Psychiatric Specialty Exam: Physical Exam  Nursing note and vitals reviewed. Constitutional: She appears well-developed.  HENT:  Head: Normocephalic and atraumatic.  Eyes: Conjunctivae are normal. Pupils are equal, round, and reactive to light.  Neck: Normal range of motion.  Cardiovascular: Regular rhythm and normal heart sounds.   Respiratory: Effort normal. No respiratory distress.  GI: Soft.  Musculoskeletal: Normal range of motion.  Neurological: She is alert.  Skin: Skin is warm and dry.  Psychiatric: Judgment normal. Her affect is blunt. Her speech is delayed. She is slowed. Thought content is not paranoid. Cognition and memory are normal. She expresses no homicidal and no suicidal ideation.    Review of Systems  Constitutional: Negative.   HENT: Negative.   Eyes: Negative.   Respiratory: Negative.   Cardiovascular: Negative.   Gastrointestinal: Negative.   Musculoskeletal: Negative.   Skin: Negative.   Neurological: Negative.   Psychiatric/Behavioral: Positive for depression, memory loss and substance abuse. Negative for hallucinations and suicidal ideas. The patient has insomnia. The patient is not nervous/anxious.     Blood pressure 113/60, pulse 79, temperature (!) 96.6 F (35.9 C), temperature source Axillary, resp. rate 19, height 5' 3"  (1.6 m), weight 48.1 kg (106 lb), SpO2 100 %.Body mass index is 18.78 kg/m.  General Appearance: Disheveled  Eye Contact:  Fair  Speech:  Slow  Volume:  Decreased  Mood:  Dysphoric   Affect:  Congruent  Thought Process:  Goal Directed  Orientation:  Full (Time, Place, and Person)  Thought Content:  Logical  Suicidal Thoughts:  No  Homicidal Thoughts:  No  Memory:  Immediate;   Good Recent;   Fair Remote;   Good  Judgement:  Fair  Insight:  Fair  Psychomotor Activity:  Decreased  Concentration:  Concentration: Good  Recall:  [place] of Knowledge:  Good  Language:  Good  Akathisia:  No  Handed:  Right  AIMS (if indicated):     Assets:  Desire for Improvement Housing Physical Health Resilience  ADL's:  Intact  Cognition:  WNL  Sleep:        Treatment Plan Summary: Plan 31 year old woman with cocaine abuse who came in passed out and dysphoric and irritable. Now has calmed down and is behaving appropriately. No sign of psychosis. No sign of mood disorder separate from cocaine abuse. No sign  of acute dangerousness. I have discontinue the involuntary commitment. Patient is referred back to [facility] with which she is familiar. Encouraged get back into AA meetings. Case reviewed with emergency room doctor. She can be discharged from the emergency room.  Disposition: Patient does not meet criteria for psychiatric inpatient admission. Supportive therapy provided about ongoing stressors.  Alethia Berthold, MD 11/05/2016 7:16 PM

## 2016-11-05 NOTE — ED Provider Notes (Signed)
-----------------------------------------   6:40 PM on 11/05/2016 -----------------------------------------  Patient has been seen by psychiatry, they believe the patient is safe for discharge home. They have rescinded the patient's IVC. Medically the patient has a possible small urinary tract infection. We will dose with a one-time dose fosfomycin.   Minna AntisKevin Breccan Galant, MD 11/05/16 [id number]

## 2016-11-05 NOTE — ED Provider Notes (Addendum)
[facility] Emergency Department Provider Note  ____________________________________________   First MD Initiated Contact with Patient 11/05/16 0207     (approximate)  I have reviewed the triage vital signs and the nursing notes.   HISTORY  Chief Complaint Medical Clearance   HPI Rebecca BloomKristina H Mcclure is a 31 y.o. female with a history of crack cocaine abuse was presented to the emergency department with police. Per police, the patient has been missing over the past 3 days. The patient says that she has been smoking crack cocaine and although she has no suicide intent at this time.  However, her involuntary commitment paperwork states that she had been telling her husband that if he left her that she would kill herself. She has a history of suicide attempts as well according to the involuntary commitment paperwork. Patient denies any drinking or other drugs besides the crack cocaine use. Patient denies any injection of drugs.   Past Medical History:  Diagnosis Date  . Drug addiction (HCC)     There are no active problems to display for this patient.   Past Surgical History:  Procedure Laterality Date  . GANGLION CYST EXCISION    . NOSE SURGERY      Prior to Admission medications   Not on File    Allergies Patient has no known allergies.  No family history on file.  Social History Social History  Substance Use Topics  . Smoking status: Current Every Day Smoker    Packs/day: 1.00    Types: Cigarettes  . Smokeless tobacco: Never Used  . Alcohol use No    Review of Systems Constitutional: No fever/chills Eyes: No visual changes. ENT: No sore throat. Cardiovascular: Denies chest pain. Respiratory: Denies shortness of breath. Gastrointestinal: No abdominal pain.  No nausea, no vomiting.  No diarrhea.  No constipation. Genitourinary: Negative for dysuria. Musculoskeletal: Negative for back pain. Skin: Negative for rash. Neurological:  Negative for headaches, focal weakness or numbness.  10-point ROS otherwise negative.  ____________________________________________   PHYSICAL EXAM:  VITAL SIGNS: ED Triage Vitals  Enc Vitals Group     BP 11/05/16 0153 (!) 143/89     Pulse Rate 11/05/16 0153 96     Resp 11/05/16 0153 (!) 26     Temp 11/05/16 0153 98.6 F (37 C)     Temp Source 11/05/16 0153 Oral     SpO2 11/05/16 0153 99 %     Weight 11/05/16 0155 106 lb (48.1 kg)     Height 11/05/16 0155 5\' 3"  (1.6 m)     Head Circumference --      Peak Flow --      Pain Score 11/05/16 0155 6     Pain Loc --      Pain Edu? --      Excl. in GC? --     Constitutional: Alert and oriented. Erratic movements all the patient is lying in the stretcher. She often grabs her popliteal fossa bilaterally and ulcer disease behind her head bilaterally. Eyes: Conjunctivae are normal. PERRL. EOMI. Head: Atraumatic. Nose: No congestion/rhinnorhea. Mouth/Throat: Mucous membranes are moist.   Neck: No stridor.   Cardiovascular: Normal rate, regular rhythm. Grossly normal heart sounds.   Respiratory: Normal respiratory effort.  No retractions. Lungs CTAB. Gastrointestinal: Soft and nontender. No distention.  Musculoskeletal: No lower extremity tenderness nor edema.  No joint effusions. Neurologic:  Normal speech and language. No gross focal neurologic deficits are appreciated.  Skin:  Skin is warm, dry and  intact. No rash noted. Psychiatric: Pressured speech. Erratic movements of her arms and legs.    ____________________________________________   LABS (all labs ordered are listed, but only abnormal results are displayed)  Labs Reviewed  COMPREHENSIVE METABOLIC PANEL - Abnormal; Notable for the following:       Result Value   Glucose, Bld 128 (*)    BUN 25 (*)    AST 104 (*)    ALT 112 (*)    All other components within normal limits  CBC - Abnormal; Notable for the following:    WBC 13.9 (*)    All other components within  normal limits  ETHANOL  URINE DRUG SCREEN, QUALITATIVE ([facility] ONLY)  ACETAMINOPHEN LEVEL  SALICYLATE LEVEL  URINALYSIS, COMPLETE ([facility]MP) WITH MICROSCOPIC  POC URINE PREG, ED   ____________________________________________  EKG   ____________________________________________  RADIOLOGY   ____________________________________________   PROCEDURES  Procedure(s) performed:   Procedures  Critical Care performed:   ____________________________________________   INITIAL IMPRESSION / ASSESSMENT AND PLAN / ED COURSE  Pertinent labs & imaging results that were available during my care of the patient were reviewed by me and considered in my medical decision making (see chart for details).  ----------------------------------------- 2:56 AM on 11/05/2016 -----------------------------------------  Patient found laying motionless briefly. However, able to be aroused easily. We will do an EKG as well as keep her on the cardiac monitor.  We will uphold the patient's involuntary commitment.      ____________________________________________   FINAL CLINICAL IMPRESSION(S) / ED DIAGNOSES  Suicidal ideation. Substance Abuse.    NEW MEDICATIONS STARTED DURING THIS VISIT:  New Prescriptions   No medications on file     Note:  This document was prepared using Dragon voice recognition software and may include unintentional dictation errors.    Myrna Blazer, MD 11/05/16 [phone]  ED ECG REPORT I, Arelia Longest, the attending physician, personally viewed and interpreted this ECG.   Date: 11/05/2016  EKG Time: 0309  Rate: 81  Rhythm: normal sinus rhythm  Axis: normal  Intervals:Mildly prolonged QT interval at 483. However, this really may be confounded by the baseline disturbance.  ST&T Change: No ST segment elevation or depression. No abnormal T-wave inversion.    Myrna Blazer, MD 11/05/16 [phone]

## 2016-11-05 NOTE — ED Notes (Signed)
Report received from Bill RN. Patient care assumed. Patient/RN introduction complete. Will continue to monitor.  

## 2016-11-06 ENCOUNTER — Encounter (HOSPITAL_COMMUNITY): Payer: Self-pay

## 2017-02-18 DIAGNOSIS — Z111 Encounter for screening for respiratory tuberculosis: Secondary | ICD-10-CM | POA: Insufficient documentation

## 2017-02-18 DIAGNOSIS — Z91410 Personal history of adult physical and sexual abuse: Secondary | ICD-10-CM | POA: Insufficient documentation

## 2017-02-18 DIAGNOSIS — Z113 Encounter for screening for infections with a predominantly sexual mode of transmission: Secondary | ICD-10-CM | POA: Insufficient documentation

## 2017-02-21 DIAGNOSIS — R768 Other specified abnormal immunological findings in serum: Secondary | ICD-10-CM | POA: Insufficient documentation

## 2017-04-16 DIAGNOSIS — K8020 Calculus of gallbladder without cholecystitis without obstruction: Secondary | ICD-10-CM | POA: Insufficient documentation

## 2017-04-24 ENCOUNTER — Emergency Department
Admission: EM | Admit: 2017-04-24 | Discharge: 2017-04-25 | Disposition: A | Discharge status: Home / Self Care | Payer: Self-pay | Attending: Emergency Medicine | Admitting: Emergency Medicine

## 2017-04-24 ENCOUNTER — Encounter: Payer: Self-pay | Admitting: Emergency Medicine

## 2017-04-24 ENCOUNTER — Emergency Department: Payer: Self-pay

## 2017-04-24 DIAGNOSIS — R109 Unspecified abdominal pain: Secondary | ICD-10-CM

## 2017-04-24 DIAGNOSIS — J45909 Unspecified asthma, uncomplicated: Secondary | ICD-10-CM | POA: Insufficient documentation

## 2017-04-24 DIAGNOSIS — K8020 Calculus of gallbladder without cholecystitis without obstruction: Secondary | ICD-10-CM

## 2017-04-24 DIAGNOSIS — F17210 Nicotine dependence, cigarettes, uncomplicated: Secondary | ICD-10-CM | POA: Insufficient documentation

## 2017-04-24 DIAGNOSIS — R1011 Right upper quadrant pain: Secondary | ICD-10-CM | POA: Insufficient documentation

## 2017-04-24 DIAGNOSIS — Z79899 Other long term (current) drug therapy: Secondary | ICD-10-CM | POA: Insufficient documentation

## 2017-04-24 LAB — COMPREHENSIVE METABOLIC PANEL
ALK PHOS: 42 U/L (ref 38–126)
ALT: 34 U/L (ref 14–54)
ANION GAP: 7 (ref 5–15)
AST: 29 U/L (ref 15–41)
Albumin: 3.8 g/dL (ref 3.5–5.0)
BILIRUBIN TOTAL: 0.4 mg/dL (ref 0.3–1.2)
BUN: 20 mg/dL (ref 6–20)
CO2: 26 mmol/L (ref 22–32)
Calcium: 9.2 mg/dL (ref 8.9–10.3)
Chloride: 107 mmol/L (ref 101–111)
Creatinine, Ser: 0.77 mg/dL (ref 0.44–1.00)
Glucose, Bld: 91 mg/dL (ref 65–99)
Potassium: 4.1 mmol/L (ref 3.5–5.1)
SODIUM: 140 mmol/L (ref 135–145)
TOTAL PROTEIN: 7 g/dL (ref 6.5–8.1)

## 2017-04-24 LAB — URINALYSIS, COMPLETE (UACMP) WITH MICROSCOPIC
BILIRUBIN URINE: NEGATIVE
Glucose, UA: NEGATIVE mg/dL
HGB URINE DIPSTICK: NEGATIVE
KETONES UR: NEGATIVE mg/dL
LEUKOCYTES UA: NEGATIVE
NITRITE: NEGATIVE
Protein, ur: NEGATIVE mg/dL
SPECIFIC GRAVITY, URINE: 1.021 (ref 1.005–1.030)
pH: 6 (ref 5.0–8.0)

## 2017-04-24 LAB — CBC
HEMATOCRIT: 36 % (ref 35.0–47.0)
HEMOGLOBIN: 12.4 g/dL (ref 12.0–16.0)
MCH: 32.8 pg (ref 26.0–34.0)
MCHC: 34.4 g/dL (ref 32.0–36.0)
MCV: 95.4 fL (ref 80.0–100.0)
Platelets: 307 10*3/uL (ref 150–440)
RBC: 3.77 MIL/uL — AB (ref 3.80–5.20)
RDW: 14.2 % (ref 11.5–14.5)
WBC: 8.1 10*3/uL (ref 3.6–11.0)

## 2017-04-24 LAB — URINE DRUG SCREEN, QUALITATIVE (ARMC ONLY)
Amphetamines, Ur Screen: NOT DETECTED
BARBITURATES, UR SCREEN: NOT DETECTED
BENZODIAZEPINE, UR SCRN: NOT DETECTED
CANNABINOID 50 NG, UR ~~LOC~~: NOT DETECTED
COCAINE METABOLITE, UR ~~LOC~~: POSITIVE — AB
MDMA (Ecstasy)Ur Screen: NOT DETECTED
METHADONE SCREEN, URINE: NOT DETECTED
OPIATE, UR SCREEN: NOT DETECTED
Phencyclidine (PCP) Ur S: NOT DETECTED
TRICYCLIC, UR SCREEN: NOT DETECTED

## 2017-04-24 LAB — POCT PREGNANCY, URINE: Preg Test, Ur: NEGATIVE

## 2017-04-24 LAB — LIPASE, BLOOD: LIPASE: 52 U/L — AB (ref 11–51)

## 2017-04-24 NOTE — ED Triage Notes (Signed)
Pt ambulatory to triage with steady gait, no distress noted. Pt reports right sided abdominal pain with N/V x3 weeks ago. Pt [facility] and told she needed to have gallbladder removed. Pt sts she has surgery consult on Aug.10. Pt to ED due to increased pain in area of gallbladder and decrease in oral intake. Pt is calm and cooperative in triage with normal speech.

## 2017-04-24 NOTE — ED Provider Notes (Signed)
[facility] Emergency Department Provider Note  ____________________________________________   I have reviewed the triage vital signs and the nursing notes.   HISTORY  Chief Complaint Abdominal Pain    HPI Rebecca Mcclure is a 31 y.o. female with a history of cocaine abuse, presents today complaining of abdominal pain. She was discharged from [facility] on July 27 for abdominal pain. She was found to have a biliary stone but no cholecystectomies performed. She is to follow-up as an outpatient with GI and surgery. It was also noted patient has hepatitis C. Patient does have a history of drug abuse, she states she has not used any cocaine or other substances in over a month. She denies any fever or chills. She's had some nausea but no vomiting. She states the pain never got better after her discharge. She is not sure why they didn't operate on her.      Past Medical History:  Diagnosis Date  . Asthma   . Bipolar 1 disorder (HCC)   . Drug addiction [facility](HCC)     Patient Active Problem List   Diagnosis Date Noted  . Cocaine abuse 11/05/2016  . Substance induced mood disorder (HCC) 11/05/2016  . Substance induced mood disorder (HCC) 02/06/2016  . Cocaine abuse 02/06/2016  . Marijuana abuse 02/06/2016  . Noncompliance 02/06/2016  . 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 GANGLION CYST EXCISION    . NOSE SURGERY      Prior to Admission medications   Medication Sig Start Date End Date Taking? Authorizing Provider  divalproex (DEPAKOTE) 500 MG DR tablet Take 1 tablet (500 mg total) by mouth 2 (two) times daily. 02/07/16   Withrow, Everardo AllJohn C, FNP  FLUoxetine (PROZAC) 20 MG capsule Take 1 capsule (20 mg total) by mouth daily. 02/07/16   Withrow, Everardo AllJohn C, FNP  hydrOXYzine (ATARAX/VISTARIL) 25 MG tablet Take 1 tablet (25 mg total) by mouth every 6 (six) hours as needed for anxiety. 02/07/16    Withrow, Everardo AllJohn C, FNP  traZODone (DESYREL) 50 MG tablet Take 1 tablet (50 mg total) by mouth at bedtime as needed for sleep. 02/07/16   Withrow, Everardo AllJohn C, FNP    Allergies Patient has no known allergies.  History reviewed. No pertinent family history.  Social History Social History  Substance Use Topics  . Smoking status: Current Every Day Smoker    Packs/day: 1.00    Types: Cigarettes  . Smokeless tobacco: Never Used  . Alcohol use No    Review of Systems Constitutional: No fever/chills Eyes: No visual changes. ENT: No sore throat. No stiff neck no neck pain Cardiovascular: Denies chest pain. Respiratory: Denies shortness of breath. Gastrointestinal:   no vomiting.  No diarrhea.  No constipation. Genitourinary: Negative for dysuria. Musculoskeletal: Negative lower extremity swelling Skin: Negative for rash. Neurological: Negative for severe headaches, focal weakness or numbness.   ____________________________________________   PHYSICAL EXAM:  VITAL SIGNS: ED Triage Vitals [04/24/17 2115]  Enc Vitals Group     BP (!) 175/98     Pulse Rate 74     Resp 17     Temp 97.9 F (36.6 C)     Temp Source Oral     SpO2 100 %     Weight 106 lb (48.1 kg)     Height      Head Circumference      Peak Flow      Pain Score  Pain Loc      Pain Edu?      Excl. in GC?     Constitutional: Alert and oriented. Well appearing and in no acute distress. Eyes: Conjunctivae are normal Head: Atraumatic HEENT: No congestion/rhinnorhea. Mucous membranes are moist.  Oropharynx non-erythematous Neck:   Nontender with no meningismus, no masses, no stridor Cardiovascular: Normal rate, regular rhythm. Grossly normal heart sounds.  Good peripheral circulation. Respiratory: Normal respiratory effort.  No retractions. Lungs CTAB. Abdominal: Soft and Mild distress with tenderness right upper quadrant. No distention. No guarding no rebound Back:  There is no focal tenderness or step off.   there is no midline tenderness there are no lesions noted. there is no CVA tenderness Musculoskeletal: No lower extremity tenderness, no upper extremity tenderness. No joint effusions, no DVT signs strong distal pulses no edema Neurologic:  Normal speech and language. No gross focal neurologic deficits are appreciated.  Skin:  Skin is warm, dry and intact. No rash noted. Psychiatric: Mood and affect are normal. Speech and behavior are normal.  ____________________________________________   LABS (all labs ordered are listed, but only abnormal results are displayed)  Labs Reviewed  LIPASE, BLOOD - Abnormal; Notable for the following:       Result Value   Lipase 52 (*)    All other components within normal limits  CBC - Abnormal; Notable for the following:    RBC 3.77 (*)    All other components within normal limits  URINALYSIS, COMPLETE (UACMP) WITH MICROSCOPIC - Abnormal; Notable for the following:    Color, Urine YELLOW (*)    APPearance CLEAR (*)    Bacteria, UA RARE (*)    Squamous Epithelial / LPF 0-5 (*)    All other components within normal limits  URINE DRUG SCREEN, QUALITATIVE ([facility] ONLY) - Abnormal; Notable for the following:    Cocaine Metabolite,Ur [place] POSITIVE (*)    All other components within normal limits  COMPREHENSIVE METABOLIC PANEL  POC URINE PREG, ED  POCT PREGNANCY, URINE   ____________________________________________  EKG  I personally interpreted any EKGs ordered by me or triage  ____________________________________________  RADIOLOGY  I reviewed any imaging ordered by me or triage that were performed during my shift and, if possible, patient and/or family made aware of any abnormal findings. ____________________________________________   PROCEDURES  Procedure(s) performed: None  Procedures  Critical Care performed: None  ____________________________________________   INITIAL IMPRESSION / ASSESSMENT AND PLAN / ED COURSE  Pertinent labs &  imaging results that were available during my care of the patient were reviewed by me and considered in my medical decision making (see chart for details).  Patient here with recurrent abdominal pain, for some reason I cannot get [facility] to cross in care everywhere. We will obtain ultrasound to make sure that there is no evidence of cholecystitis. Blood work is reassuring fever is absent at home and here, no evidence of significant intra-abdominal pathology and blood work, abdomen is nonsurgical. Patient is positive for cocaine. She states that she hadn't used in well over a month. When I confronted her with a positive result she then admitted that she actually has been using over the last couple days. She did want her husband present for that conversation, initially I did ask him to leave. Patient has had no vomiting. She is quite well-appearing. Given ongoing cocaine abuse, we will forego narcotic pain medication pending verification of ongoing pathology.    ____________________________________________   FINAL CLINICAL IMPRESSION(S) / ED  DIAGNOSES  Final diagnoses:  Abdominal pain      This chart was dictated using voice recognition software.  Despite best efforts to proofread,  errors can occur which can change meaning.      Jeanmarie PlantMcShane, Garland Smouse A, MD 04/24/17 2232

## 2017-04-25 MED ORDER — PROMETHAZINE HCL 12.5 MG PO TABS: 12.5000 mg | ORAL_TABLET | Freq: Four times a day (QID) | ORAL | 0 refills | Status: DC | PRN

## 2017-04-25 MED ORDER — OXYCODONE-ACETAMINOPHEN 5-325 MG PO TABS
1.0000 | ORAL_TABLET | Freq: Once | ORAL | Status: AC
  Administered 2017-04-25: 1 via ORAL
  Filled 2017-04-25: qty 1

## 2017-04-25 MED ORDER — KETOROLAC TROMETHAMINE 30 MG/ML IJ SOLN
15.0000 mg | Freq: Once | INTRAMUSCULAR | Status: DC
  Filled 2017-04-25: qty 1

## 2017-04-25 MED ORDER — DICYCLOMINE HCL 10 MG PO CAPS
10.0000 mg | ORAL_CAPSULE | Freq: Once | ORAL | Status: AC
  Administered 2017-04-25: 10 mg via ORAL
  Filled 2017-04-25: qty 1

## 2017-04-25 MED ORDER — OXYCODONE-ACETAMINOPHEN 10-325 MG PO TABS: 1.0000 | ORAL_TABLET | Freq: Four times a day (QID) | ORAL | 0 refills | Status: DC | PRN

## 2017-04-25 MED ORDER — KETOROLAC TROMETHAMINE 30 MG/ML IJ SOLN
30.0000 mg | Freq: Once | INTRAMUSCULAR | Status: AC
  Administered 2017-04-25: 30 mg via INTRAMUSCULAR

## 2017-04-25 NOTE — ED Notes (Signed)
Pt ambulatory in room without difficulty. She was able to tolerate PO intake in the form of pills without emesis.

## 2017-04-25 NOTE — ED Provider Notes (Signed)
Patient received in sign-out from Dr. Alphonzo LemmingsMcShane.  Workup and evaluation pending RUQ US.  Patient remains nontoxic in no acute distress. Right upper quadrant ultrasound does show large gallbladder stone lodged in the gallbladder neck without any evidence or cholecystic fluid. Her abdominal exam is non-peritoneal. Blood work is fairly reassuring. A spoke with Dr. Tonita CongWoodham of general surgery regarding the patient's presentation. Plan is to try to get the patient's pain under control. If able to control her pain with oral medications the patient will be stable and appropriate for close follow-up in general surgery clinic for outpatient management. If unable to control her pain patient will be admitted for pain control.   ----------------------------------------- 2:28 AM on 04/25/2017 -----------------------------------------  Patient management and hemodynamically stable. Requesting discharge home. Patient tolerated oral hydration. Patient will be discharged with pain medications as well as antiemetics. Patient given referral for surgery. Discussed signs and symptoms for which she should return immediately particularly if she were to develop any fever or worsening pain and inability to tolerate any oral medications.   Willy Eddyobinson, Khalee Mazo, MD 04/25/17 [phone]

## 2017-04-25 NOTE — Discharge Instructions (Addendum)

## 2017-04-28 ENCOUNTER — Ambulatory Visit (INDEPENDENT_AMBULATORY_CARE_PROVIDER_SITE_OTHER): Payer: Self-pay | Admitting: General Surgery

## 2017-04-28 ENCOUNTER — Encounter: Payer: Self-pay | Admitting: General Surgery

## 2017-04-28 VITALS — BP 107/76 | HR 90 | Temp 98.5°F | Ht 63.0 in | Wt 117.0 lb

## 2017-04-28 DIAGNOSIS — K8050 Calculus of bile duct without cholangitis or cholecystitis without obstruction: Secondary | ICD-10-CM

## 2017-04-28 NOTE — Progress Notes (Signed)
Patient ID: Rebecca Mcclure, female   DOB: 08/25/1986, 31 y.o.   MRN: [id number]  CC: ABD PAIN  HPI Rebecca Mcclure is a 31 y.o. female who presents to clinic for ER follow up. Patient reports that she has been having post prandial right upper quadrant pain for the past 2 weeks. The pain is much worse after fatty meals. Patient reports that she has had some nausea but denies vomiting. She denies any fevers, chills, chest pain, shortness of breath, diarrhea or constipation. She reports she was in the hospital at [facility] for the pain but they did no perform the surgery. She was seen in the ER and found to have gallstones without cholecystitis over the weekend. She does have a history of substance abuse but states she has been clean for the past month. Her mother is currently in treatment for pancreatic cancer as well.  HPI  Past Medical History:  Diagnosis Date  . Asthma   . Bipolar 1 disorder (HCC)   . Drug addiction [facility])     Past Surgical History:  Procedure Laterality Date  . GANGLION CYST EXCISION    . NOSE SURGERY      History reviewed. No pertinent family history. Mother with pancreatic cancer, no other known cancers, heart disease or DM.  Social History Social History  Substance Use Topics  . Smoking status: Current Every Day Smoker    Packs/day: 1.00    Types: Cigarettes  . Smokeless tobacco: Never Used  . Alcohol use No    No Known Allergies  No current outpatient prescriptions on file.   No current facility-administered medications for this visit.      Review of Systems A multi-point review of systems was asked and was negative except for the findings documented in the HPI, the remainder are negative.  Physical Exam Blood pressure 107/76, pulse 90, temperature 98.5 F (36.9 C), temperature source Oral, height 5\' 3"  (1.6 m), weight 53.1 kg (117 lb). CONSTITUTIONAL: NAD. EYES: Pupils are equal, round, and reactive to light, Sclera are non-icteric. EARS, NOSE,  MOUTH AND THROAT: The oropharynx is clear. The oral mucosa is pink and moist. Hearing is intact to voice. LYMPH NODES:  Lymph nodes in the neck are normal. RESPIRATORY:  Lungs are clear. There is normal respiratory effort, with equal breath sounds bilaterally, and without pathologic use of accessory muscles. CARDIOVASCULAR: Heart is regular without murmurs, gallops, or rubs. GI: The abdomen is soft, mildly tender to palpation in the RUQ but with a negative murphy's sign, and nondistended. There are no palpable masses. There is no hepatosplenomegaly. There are normal bowel sounds in all quadrants. GU: Rectal deferred.   MUSCULOSKELETAL: Normal muscle strength and tone. No cyanosis or edema.   SKIN: Turgor is good and there are no pathologic skin lesions or ulcers. NEUROLOGIC: Motor and sensation is grossly normal. Cranial nerves are grossly intact. PSYCH:  Oriented to person, place and time. Affect is normal.  Data Reviewed Images and labs reviewed. Labs from the ER are all WNL, U/S shows cholelithiasis without cholecystitis I have personally reviewed the patient's imaging, laboratory findings and medical records.    Assessment    Biliary Colic.    Plan    31 year old female with biliary colic. I discussed the procedure in detail.  We discussed the risks and benefits of a laparoscopic cholecystectomy and possible cholangiogram including, but not limited to bleeding, infection, injury to surrounding structures such as the intestine or liver, bile leak, retained  gallstones, need to convert to an open procedure, prolonged diarrhea, blood clots such as  DVT, common bile duct injury, anesthesia risks, and possible need for additional procedures.  The likelihood of improvement in symptoms and return to the patient's normal status is good. We discussed the typical post-operative recovery course. Patient voiced understanding and wishes to proceed as soon as possible. I discussed the patient with my  partner Dr. Everlene FarrierPabon who will be able to perform the surgery later this week. Patient voiced understanding that she will need to have a clean urine drug screen prior to surgery as well.     Time spent with the patient was 45 minutes, with more than 50% of the time spent in face-to-face education, counseling and care coordination.     Ricarda Frameharles Lulabelle Desta, MD FACS General Surgeon 04/28/2017, 9:10 PM

## 2017-04-28 NOTE — Patient Instructions (Signed)
We have schedule your Laparoscopic Cholecystectomy with Dr. Everlene FarrierPabon on 8/9.  Please see your blue sheet for further instruction.

## 2017-04-29 ENCOUNTER — Telehealth: Payer: Self-pay | Admitting: Surgery

## 2017-04-29 ENCOUNTER — Other Ambulatory Visit: Payer: Self-pay

## 2017-04-29 DIAGNOSIS — Z01818 Encounter for other preprocedural examination: Secondary | ICD-10-CM

## 2017-04-29 NOTE — Telephone Encounter (Signed)
Pt advised of pre op date/time and sx date. Sx: 05/01/17 with Dr Pabon--Laparoscopic cholecystectomy Pre op: 2 hours prior to surgery-patient will arrive at 8:45am--office the day of surgery.

## 2017-04-30 ENCOUNTER — Telehealth: Payer: Self-pay

## 2017-04-30 NOTE — Telephone Encounter (Signed)
Spoke with Dr. Tonita CongWoodham about patient cancelling her surgery due to having an URI. He stated that patient would need to be seen again with a surgeon so we could reschedule her surgery date. I also told him about patient requesting pain medication and he stated that he would not prescribe any pain medication. I will inform patient about this.  Called patient to let her know that Dr. Tonita CongWoodham would not be able to write her a prescription for pain since he has not done surgery on her. However, I explained to the patient that if her pain got unbearable, to go to the emergency room. Patient was also informed that we would have to see her back next week so we could reschedule her surgery two weeks from today. Patient understood and had no further questions.

## 2017-04-30 NOTE — Telephone Encounter (Signed)
Patient called stating that she needed to cancel her surgery for tomorrow (05/01/2017) because she has a sinus infection. I told her that I would go ahead and cancel it. Patient also stated that she was to receive a prescription for her pain last time she was here. Therefore, she is asking for a pain medication prescription. I told her that I would have to ask Dr. Tonita CongWoodham. Patient understood. I will call her back once I speak with Dr. Tonita CongWoodham.

## 2017-05-01 ENCOUNTER — Encounter: Admission: RE | Payer: Self-pay | Source: Ambulatory Visit

## 2017-05-01 ENCOUNTER — Ambulatory Visit: Admission: RE | Admit: 2017-05-01 | Payer: Self-pay | Source: Ambulatory Visit | Admitting: Surgery

## 2017-05-01 SURGERY — LAPAROSCOPIC CHOLECYSTECTOMY
Anesthesia: Choice

## 2017-05-03 ENCOUNTER — Emergency Department
Admission: EM | Admit: 2017-05-03 | Discharge: 2017-05-03 | Disposition: A | Discharge status: Home / Self Care | Payer: Self-pay | Attending: Emergency Medicine | Admitting: Emergency Medicine

## 2017-05-03 ENCOUNTER — Encounter: Payer: Self-pay | Admitting: Emergency Medicine

## 2017-05-03 ENCOUNTER — Emergency Department: Payer: Self-pay

## 2017-05-03 DIAGNOSIS — Y929 Unspecified place or not applicable: Secondary | ICD-10-CM | POA: Insufficient documentation

## 2017-05-03 DIAGNOSIS — S0083XA Contusion of other part of head, initial encounter: Secondary | ICD-10-CM

## 2017-05-03 DIAGNOSIS — Y999 Unspecified external cause status: Secondary | ICD-10-CM | POA: Insufficient documentation

## 2017-05-03 DIAGNOSIS — J45998 Other asthma: Secondary | ICD-10-CM | POA: Insufficient documentation

## 2017-05-03 DIAGNOSIS — Z23 Encounter for immunization: Secondary | ICD-10-CM | POA: Insufficient documentation

## 2017-05-03 DIAGNOSIS — S01511A Laceration without foreign body of lip, initial encounter: Secondary | ICD-10-CM | POA: Insufficient documentation

## 2017-05-03 DIAGNOSIS — Y939 Activity, unspecified: Secondary | ICD-10-CM | POA: Insufficient documentation

## 2017-05-03 DIAGNOSIS — F17210 Nicotine dependence, cigarettes, uncomplicated: Secondary | ICD-10-CM | POA: Insufficient documentation

## 2017-05-03 LAB — BASIC METABOLIC PANEL
Anion gap: 6 (ref 5–15)
BUN: 15 mg/dL (ref 6–20)
CALCIUM: 8.9 mg/dL (ref 8.9–10.3)
CHLORIDE: 108 mmol/L (ref 101–111)
CO2: 26 mmol/L (ref 22–32)
CREATININE: 0.68 mg/dL (ref 0.44–1.00)
GFR calc Af Amer: >60 mL/min (ref >60)
GFR calc non Af Amer: >60 mL/min (ref >60)
GLUCOSE: 85 mg/dL (ref 65–99)
Potassium: 3.2 mmol/L — ABNORMAL LOW (ref 3.5–5.1)
Sodium: 140 mmol/L (ref 135–145)

## 2017-05-03 LAB — CBC WITH DIFFERENTIAL/PLATELET
BASOS PCT: 1 %
Basophils Absolute: 0.1 10*3/uL (ref 0–0.1)
Eosinophils Absolute: 0.2 10*3/uL (ref 0–0.7)
Eosinophils Relative: 1 %
HEMATOCRIT: 34.6 % — AB (ref 35.0–47.0)
Hemoglobin: 11.6 g/dL — ABNORMAL LOW (ref 12.0–16.0)
LYMPHS PCT: 30 %
Lymphs Abs: 3.8 10*3/uL — ABNORMAL HIGH (ref 1.0–3.6)
MCH: 31.7 pg (ref 26.0–34.0)
MCHC: 33.5 g/dL (ref 32.0–36.0)
MCV: 94.6 fL (ref 80.0–100.0)
MONO ABS: 0.8 10*3/uL (ref 0.2–0.9)
Monocytes Relative: 6 %
NEUTROS PCT: 62 %
Neutro Abs: 7.8 10*3/uL — ABNORMAL HIGH (ref 1.4–6.5)
Platelets: 337 10*3/uL (ref 150–440)
RBC: 3.66 MIL/uL — ABNORMAL LOW (ref 3.80–5.20)
RDW: 13.8 % (ref 11.5–14.5)
WBC: 12.7 10*3/uL — AB (ref 3.6–11.0)

## 2017-05-03 LAB — ETHANOL: Alcohol, Ethyl (B): <5 mg/dL (ref <5)

## 2017-05-03 MED ORDER — LIDOCAINE HCL (PF) 1 % IJ SOLN
5.0000 mL | Freq: Once | INTRAMUSCULAR | Status: AC
  Administered 2017-05-03: 5 mL via INTRADERMAL
  Filled 2017-05-03: qty 5

## 2017-05-03 MED ORDER — LIDOCAINE-EPINEPHRINE-TETRACAINE (LET) SOLUTION
3.0000 mL | Freq: Once | NASAL | Status: AC
  Administered 2017-05-03: 3 mL via TOPICAL
  Filled 2017-05-03: qty 3

## 2017-05-03 MED ORDER — SODIUM CHLORIDE 0.9 % IV BOLUS (SEPSIS)
1000.0000 mL | Freq: Once | INTRAVENOUS | Status: AC
  Administered 2017-05-03: 1000 mL via INTRAVENOUS

## 2017-05-03 MED ORDER — TETANUS-DIPHTH-ACELL PERTUSSIS 5-2.5-18.5 LF-MCG/0.5 IM SUSP
0.5000 mL | Freq: Once | INTRAMUSCULAR | Status: AC
  Administered 2017-05-03: 0.5 mL via INTRAMUSCULAR
  Filled 2017-05-03: qty 0.5
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
## 2017-05-03 NOTE — Discharge Instructions (Signed)
You may take tylenol and/or ibuprofen as needed for discomfort. Apply ice over affected area several times daily. Return to the ER for worsening symptoms, persistent vomiting, lethargy or other concerns.

## 2017-05-03 NOTE — ED Notes (Signed)
Patient ambulatory to the bathroom at this time.
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
## 2017-05-03 NOTE — ED Provider Notes (Signed)
Patient received in sign-out from Dr. Dolores FrameSung.  Workup and evaluation pending reassessment.  Patient with evidence of right upper lip laceration that is through and through.  No retained FB.  No dental trauma.  Plan lac repair  .Marland Kitchen.Laceration Repair Date/Time: 05/03/2017 11:36 AM Performed by: Willy EddyOBINSON, Blanca Thornton Authorized by: Willy EddyOBINSON, Glenda Spelman   Consent:    Consent obtained:  Verbal   Consent given by:  Patient   Risks discussed:  Infection, pain, poor cosmetic result, need for additional repair, nerve damage and poor wound healing   Alternatives discussed:  No treatment Anesthesia (see MAR for exact dosages):    Anesthesia method:  Topical application and local infiltration   Topical anesthetic:  LET   Local anesthetic:  Lidocaine 1% w/o epi Laceration details:    Location:  Lip   Lip location:  Upper lip, full thickness   Vermilion border involved: no     Length (cm):  2   Depth (mm):  10 Repair type:    Repair type:  Intermediate Pre-procedure details:    Preparation:  Patient was prepped and draped in usual sterile fashion Exploration:    Wound exploration: wound explored through full range of motion and entire depth of wound probed and visualized   Treatment:    Area cleansed with:  Hibiclens and saline   Amount of cleaning:  Extensive   Irrigation solution:  Sterile saline   Irrigation method:  Pressure wash   Visualized foreign bodies/material removed: no   Mucous membrane repair:    Suture size:  6-0   Suture material:  Vicryl   Suture technique:  Simple interrupted   Number of sutures:  3 Skin repair:    Repair method:  Sutures   Suture size:  6-0   Suture material:  Prolene   Suture technique:  Simple interrupted   Number of sutures:  3 Approximation:    Approximation:  Close   Vermilion border: well-aligned   Post-procedure details:    Dressing:  Open (no dressing)   Patient tolerance of procedure:  Tolerated well, no immediate complications          Willy Eddyobinson, Doyl Bitting, MD 05/03/17 1137
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
## 2017-05-03 NOTE — ED Notes (Signed)
Pt taken to CT.

## 2017-05-03 NOTE — ED Notes (Signed)
Lidocaine administered by Roxan Hockeyobinson MD. Pt in NAD at this time.

## 2017-05-03 NOTE — ED Notes (Signed)
Dr. Roxan Hockeyobinson assessing patient's lip and mouth at this time.

## 2017-05-03 NOTE — ED Notes (Signed)
Patient has dried blood to the right side of her face.  This RN attempted to clean with warm wet washcloths but patient could only tolerate very gentle cleaning so some dried blood remains.  Small laceration noted to patient's upper lip.  Dr. Roxan Hockeyobinson notified.  Patient appears to be sleeping at this time.

## 2017-05-09 ENCOUNTER — Ambulatory Visit: Payer: Self-pay | Admitting: Surgery

## 2017-05-12 ENCOUNTER — Emergency Department
Admission: EM | Admit: 2017-05-12 | Discharge: 2017-05-12 | Disposition: A | Discharge status: Home / Self Care | Payer: Self-pay | Attending: Emergency Medicine | Admitting: Emergency Medicine

## 2017-05-12 ENCOUNTER — Emergency Department: Payer: Self-pay

## 2017-05-12 ENCOUNTER — Encounter: Payer: Self-pay | Admitting: *Deleted

## 2017-05-12 DIAGNOSIS — J45909 Unspecified asthma, uncomplicated: Secondary | ICD-10-CM | POA: Insufficient documentation

## 2017-05-12 DIAGNOSIS — F17210 Nicotine dependence, cigarettes, uncomplicated: Secondary | ICD-10-CM | POA: Insufficient documentation

## 2017-05-12 DIAGNOSIS — F1410 Cocaine abuse, uncomplicated: Secondary | ICD-10-CM | POA: Insufficient documentation

## 2017-05-12 DIAGNOSIS — K8060 Calculus of gallbladder and bile duct with cholecystitis, unspecified, without obstruction: Secondary | ICD-10-CM | POA: Insufficient documentation

## 2017-05-12 DIAGNOSIS — Z4802 Encounter for removal of sutures: Secondary | ICD-10-CM | POA: Insufficient documentation

## 2017-05-12 DIAGNOSIS — R109 Unspecified abdominal pain: Secondary | ICD-10-CM | POA: Insufficient documentation

## 2017-05-12 DIAGNOSIS — K8050 Calculus of bile duct without cholangitis or cholecystitis without obstruction: Secondary | ICD-10-CM

## 2017-05-12 DIAGNOSIS — Z5189 Encounter for other specified aftercare: Secondary | ICD-10-CM

## 2017-05-12 LAB — URINE DRUG SCREEN, QUALITATIVE (ARMC ONLY)
Amphetamines, Ur Screen: NOT DETECTED
BARBITURATES, UR SCREEN: NOT DETECTED
BENZODIAZEPINE, UR SCRN: NOT DETECTED
CANNABINOID 50 NG, UR ~~LOC~~: NOT DETECTED
COCAINE METABOLITE, UR ~~LOC~~: POSITIVE — AB
MDMA (Ecstasy)Ur Screen: NOT DETECTED
Methadone Scn, Ur: NOT DETECTED
Opiate, Ur Screen: NOT DETECTED
Phencyclidine (PCP) Ur S: NOT DETECTED
TRICYCLIC, UR SCREEN: NOT DETECTED

## 2017-05-12 LAB — URINALYSIS, COMPLETE (UACMP) WITH MICROSCOPIC
BILIRUBIN URINE: NEGATIVE
Glucose, UA: NEGATIVE mg/dL
Hgb urine dipstick: NEGATIVE
KETONES UR: NEGATIVE mg/dL
LEUKOCYTES UA: NEGATIVE
Nitrite: NEGATIVE
PROTEIN: NEGATIVE mg/dL
Specific Gravity, Urine: 1.02 (ref 1.005–1.030)
pH: 6 (ref 5.0–8.0)

## 2017-05-12 LAB — COMPREHENSIVE METABOLIC PANEL
ALT: 15 U/L (ref 14–54)
AST: 18 U/L (ref 15–41)
Albumin: 3.5 g/dL (ref 3.5–5.0)
Alkaline Phosphatase: 44 U/L (ref 38–126)
Anion gap: 8 (ref 5–15)
BUN: 14 mg/dL (ref 6–20)
CHLORIDE: 104 mmol/L (ref 101–111)
CO2: 28 mmol/L (ref 22–32)
Calcium: 8.9 mg/dL (ref 8.9–10.3)
Creatinine, Ser: 0.85 mg/dL (ref 0.44–1.00)
Glucose, Bld: 109 mg/dL — ABNORMAL HIGH (ref 65–99)
POTASSIUM: 3.5 mmol/L (ref 3.5–5.1)
SODIUM: 140 mmol/L (ref 135–145)
Total Bilirubin: 0.4 mg/dL (ref 0.3–1.2)
Total Protein: 7.2 g/dL (ref 6.5–8.1)

## 2017-05-12 LAB — CBC
HEMATOCRIT: 36.9 % (ref 35.0–47.0)
Hemoglobin: 12.6 g/dL (ref 12.0–16.0)
MCH: 32.8 pg (ref 26.0–34.0)
MCHC: 34.2 g/dL (ref 32.0–36.0)
MCV: 96 fL (ref 80.0–100.0)
Platelets: 440 10*3/uL (ref 150–440)
RBC: 3.85 MIL/uL (ref 3.80–5.20)
RDW: 14 % (ref 11.5–14.5)
WBC: 9.9 10*3/uL (ref 3.6–11.0)

## 2017-05-12 LAB — POCT PREGNANCY, URINE: PREG TEST UR: NEGATIVE

## 2017-05-12 LAB — LIPASE, BLOOD: LIPASE: 43 U/L (ref 11–51)

## 2017-05-12 MED ORDER — CEPHALEXIN 500 MG PO CAPS: 500.0000 mg | ORAL_CAPSULE | Freq: Three times a day (TID) | ORAL | 0 refills | Status: AC
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
## 2017-05-12 NOTE — ED Notes (Addendum)
Patient seen on 8/11 and had sutures put in lac on lip. Reports one suture came out three days later. States she didn't return to ED because she didn't have time. Also complaining of possible infection to right index finger. Additionally, patient states she has had issues with her gallbladder in the past and states she is "having trouble with that again".

## 2017-05-12 NOTE — ED Notes (Signed)
Verified with lab that they had enough urine to run UDS. Say they will run it now.
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
## 2017-06-13 ENCOUNTER — Emergency Department
Admission: EM | Admit: 2017-06-13 | Discharge: 2017-06-15 | Disposition: A | Discharge status: Home / Self Care | Payer: Self-pay | Attending: Student in an Organized Health Care Education/Training Program | Admitting: Student in an Organized Health Care Education/Training Program

## 2017-06-13 ENCOUNTER — Encounter: Payer: Self-pay | Admitting: Emergency Medicine

## 2017-06-13 DIAGNOSIS — F1994 Other psychoactive substance use, unspecified with psychoactive substance-induced mood disorder: Secondary | ICD-10-CM | POA: Diagnosis present

## 2017-06-13 DIAGNOSIS — F17210 Nicotine dependence, cigarettes, uncomplicated: Secondary | ICD-10-CM | POA: Insufficient documentation

## 2017-06-13 DIAGNOSIS — F1910 Other psychoactive substance abuse, uncomplicated: Secondary | ICD-10-CM

## 2017-06-13 DIAGNOSIS — L03011 Cellulitis of right finger: Secondary | ICD-10-CM | POA: Insufficient documentation

## 2017-06-13 DIAGNOSIS — R45851 Suicidal ideations: Secondary | ICD-10-CM | POA: Insufficient documentation

## 2017-06-13 DIAGNOSIS — F1914 Other psychoactive substance abuse with psychoactive substance-induced mood disorder: Secondary | ICD-10-CM | POA: Insufficient documentation

## 2017-06-13 DIAGNOSIS — J45909 Unspecified asthma, uncomplicated: Secondary | ICD-10-CM | POA: Insufficient documentation

## 2017-06-13 DIAGNOSIS — F319 Bipolar disorder, unspecified: Secondary | ICD-10-CM | POA: Insufficient documentation

## 2017-06-13 DIAGNOSIS — F1410 Cocaine abuse, uncomplicated: Secondary | ICD-10-CM | POA: Diagnosis present

## 2017-06-13 LAB — COMPREHENSIVE METABOLIC PANEL
ALK PHOS: 66 U/L (ref 38–126)
ALT: 123 U/L — ABNORMAL HIGH (ref 14–54)
ANION GAP: 10 (ref 5–15)
AST: 100 U/L — ABNORMAL HIGH (ref 15–41)
Albumin: 4.4 g/dL (ref 3.5–5.0)
BILIRUBIN TOTAL: 0.9 mg/dL (ref 0.3–1.2)
BUN: 22 mg/dL — AB (ref 6–20)
CO2: 21 mmol/L — ABNORMAL LOW (ref 22–32)
Calcium: 9.3 mg/dL (ref 8.9–10.3)
Chloride: 99 mmol/L — ABNORMAL LOW (ref 101–111)
Creatinine, Ser: 0.97 mg/dL (ref 0.44–1.00)
GFR calc Af Amer: >60 mL/min (ref >60)
Glucose, Bld: 118 mg/dL — ABNORMAL HIGH (ref 65–99)
POTASSIUM: 3.3 mmol/L — AB (ref 3.5–5.1)
Sodium: 130 mmol/L — ABNORMAL LOW (ref 135–145)
TOTAL PROTEIN: 8 g/dL (ref 6.5–8.1)

## 2017-06-13 LAB — HCG, QUANTITATIVE, PREGNANCY: hCG, Beta Chain, Quant, S: 1 m[IU]/mL (ref <5)

## 2017-06-13 LAB — SALICYLATE LEVEL: Salicylate Lvl: <7 mg/dL (ref 2.8–30.0)

## 2017-06-13 LAB — CBC
HCT: 37.9 % (ref 35.0–47.0)
Hemoglobin: 13 g/dL (ref 12.0–16.0)
MCH: 31.9 pg (ref 26.0–34.0)
MCHC: 34.3 g/dL (ref 32.0–36.0)
MCV: 93 fL (ref 80.0–100.0)
Platelets: 384 10*3/uL (ref 150–440)
RBC: 4.07 MIL/uL (ref 3.80–5.20)
RDW: 13.7 % (ref 11.5–14.5)
WBC: 15.4 10*3/uL — AB (ref 3.6–11.0)

## 2017-06-13 LAB — ACETAMINOPHEN LEVEL: ACETAMINOPHEN (TYLENOL), SERUM: 30 ug/mL (ref 10–30)

## 2017-06-13 LAB — ETHANOL

## 2017-06-13 MED ORDER — HALOPERIDOL LACTATE 5 MG/ML IJ SOLN
5.0000 mg | Freq: Once | INTRAMUSCULAR | Status: AC
  Administered 2017-06-13: 5 mg via INTRAMUSCULAR
  Filled 2017-06-13: qty 1

## 2017-06-13 MED ORDER — MUPIROCIN CALCIUM 2 % EX CREA
TOPICAL_CREAM | Freq: Once | CUTANEOUS | Status: AC
  Administered 2017-06-13: 20:00:00 via TOPICAL
  Filled 2017-06-13: qty 15

## 2017-06-13 MED ORDER — BUPIVACAINE HCL (PF) 0.5 % IJ SOLN
30.0000 mL | Freq: Once | INTRAMUSCULAR | Status: DC
  Filled 2017-06-13: qty 30

## 2017-06-13 NOTE — ED Notes (Signed)
Call for bactroban no answer

## 2017-06-13 NOTE — Consult Note (Signed)
Rebecca Mcclure   Reason for Mcclure:  Mcclure for 31 year old woman with a history of drug abuse and behavior problems brought to the hospital under IVC Referring Physician:  Quentin Cornwall Patient Identification: Rebecca Mcclure MRN:  [id number] Principal Diagnosis: Substance induced mood disorder [facility]) Diagnosis:   Patient Active Problem List   Diagnosis Date Noted  . Biliary colic [V67.20] 94/70/9628  . Cocaine abuse [F14.10] 11/05/2016  . Substance induced mood disorder ([place]) [F19.94] 11/05/2016  . Substance induced mood disorder ([place]) [F19.94] 02/06/2016  . Cocaine abuse [F14.10] 02/06/2016  . Marijuana abuse [F12.10] 02/06/2016  . Noncompliance [Z91.19] 02/06/2016  . Cocaine-induced mood disorder [facility]) [F14.94] 02/06/2016    Total Time spent with patient: 45 minutes  Subjective:   Rebecca Mcclure is a 31 y.o. female patient admitted with patient not able to answer questions.  HPI:  Attempted to interview patient. Chart reviewed. 31 year old woman with a history of cocaine abuse come to the hospital on petition. Allegations of suicidal ideation. Notes from nursing indicate that on presentation the patient admitted to cocaine abuse and denied suicidal intent or plan. Apparently became agitated enough that she required sedating medication and by the time I got to her she was knocked out and unresponsive.  Social history: Family continue to try to be involved in helping her but patient often remains resistant. Unknown where she is currently staying.    Medical history: No significant medical problems  Substance abuse history: History of cocaine abuse and marijuana abuse with frequent cocaine-induced mood disorder and difficulty maintaining long sobriety  Past Psychiatric History: Past presentations to the hospital largely revolved around cocaine abuse and it causing her to become agitated threatening or suicidal. Patient has often resisted treatment in the past  unknown what her current behaviors of been. Does not appear to be on any psychiatric medicine no known history of actual suicide attempts  Risk to Self: Is patient at risk for suicide?: Yes Risk to Others:   Prior Inpatient Therapy:   Prior Outpatient Therapy:    Past Medical History:  Past Medical History:  Diagnosis Date  . Asthma   . Bipolar 1 disorder ([place])   . Drug addiction [facility])     Past Surgical History:  Procedure Laterality Date  . GANGLION CYST EXCISION    . NOSE SURGERY     Family History: No family history on file. Family Psychiatric  History: Unknown Social History:  History  Alcohol Use No     History  Drug Use  . Types: Marijuana, Cocaine    Comment: Last used cocaine 2 days ago    Social History   Social History  . Marital status: Married    Spouse name: N/A  . Number of children: N/A  . Years of education: N/A   Social History Main Topics  . Smoking status: Current Every Day Smoker    Packs/day: 1.00    Types: Cigarettes  . Smokeless tobacco: Never Used  . Alcohol use No  . Drug use: Yes    Types: Marijuana, Cocaine     Comment: Last used cocaine 2 days ago  . Sexual activity: Yes    Birth control/ protection: None   Other Topics Concern  . None   Social History Narrative   ** Merged History Encounter **       Additional Social History:    Allergies:  No Known Allergies  Labs:  Results for orders placed or performed during the hospital encounter of 06/13/17 (  from the past 48 hour(s))  Comprehensive metabolic panel     Status: Abnormal   Collection Time: 06/13/17  5:13 PM  Result Value Ref Range   Sodium 130 (L) 135 - 145 mmol/L   Potassium 3.3 (L) 3.5 - 5.1 mmol/L   Chloride 99 (L) 101 - 111 mmol/L   CO2 21 (L) 22 - 32 mmol/L   Glucose, Bld 118 (H) 65 - 99 mg/dL   BUN 22 (H) 6 - 20 mg/dL   Creatinine, Ser 0.97 0.44 - 1.00 mg/dL   Calcium 9.3 8.9 - 10.3 mg/dL   Total Protein 8.0 6.5 - 8.1 g/dL   Albumin 4.4 3.5 - 5.0 g/dL    AST 100 (H) 15 - 41 U/L   ALT 123 (H) 14 - 54 U/L   Alkaline Phosphatase 66 38 - 126 U/L   Total Bilirubin 0.9 0.3 - 1.2 mg/dL   GFR calc non Af Amer >60 >60 mL/min   GFR calc Af Amer >60 >60 mL/min    Comment: (NOTE) The eGFR has been calculated using the CKD EPI equation. This calculation has not been validated in all clinical situations. eGFR's persistently <60 mL/min signify possible Chronic Kidney Disease.    Anion gap 10 5 - 15  Ethanol     Status: None   Collection Time: 06/13/17  5:13 PM  Result Value Ref Range   Alcohol, Ethyl (B) <5 <5 mg/dL    Comment:        LOWEST DETECTABLE LIMIT FOR SERUM ALCOHOL IS 5 mg/dL FOR MEDICAL PURPOSES ONLY   Salicylate level     Status: None   Collection Time: 06/13/17  5:13 PM  Result Value Ref Range   Salicylate Lvl <7.0 2.8 - 30.0 mg/dL  Acetaminophen level     Status: None   Collection Time: 06/13/17  5:13 PM  Result Value Ref Range   Acetaminophen (Tylenol), Serum 30 10 - 30 ug/mL    Comment:        THERAPEUTIC CONCENTRATIONS VARY SIGNIFICANTLY. A RANGE OF 10-30 ug/mL MAY BE AN EFFECTIVE CONCENTRATION FOR MANY PATIENTS. HOWEVER, SOME ARE BEST TREATED AT CONCENTRATIONS OUTSIDE THIS RANGE. ACETAMINOPHEN CONCENTRATIONS >150 ug/mL AT 4 HOURS AFTER INGESTION AND >50 ug/mL AT 12 HOURS AFTER INGESTION ARE OFTEN ASSOCIATED WITH TOXIC REACTIONS.   cbc     Status: Abnormal   Collection Time: 06/13/17  5:13 PM  Result Value Ref Range   WBC 15.4 (H) 3.6 - 11.0 K/uL   RBC 4.07 3.80 - 5.20 MIL/uL   Hemoglobin 13.0 12.0 - 16.0 g/dL   HCT 37.9 35.0 - 47.0 %   MCV 93.0 80.0 - 100.0 fL   MCH 31.9 26.0 - 34.0 pg   MCHC 34.3 32.0 - 36.0 g/dL   RDW 13.7 11.5 - 14.5 %   Platelets 384 150 - 440 K/uL  hCG, quantitative, pregnancy     Status: None   Collection Time: 06/13/17  5:13 PM  Result Value Ref Range   hCG, Beta Chain, Quant, S 1 <5 mIU/mL    Comment:          GEST. AGE      CONC.  (mIU/mL)   <=1 WEEK        5 - 50     2  WEEKS       50 - 500     3 WEEKS       100 - 10,000     4 WEEKS  1,000 - 30,000     5 WEEKS     3,500 - 115,000   6-8 WEEKS     12,000 - 270,000    12 WEEKS     15,000 - 220,000        FEMALE AND NON-PREGNANT FEMALE:     LESS THAN 5 mIU/mL     No current facility-administered medications for this encounter.    No current outpatient prescriptions on file.    Musculoskeletal: Strength & Muscle Tone: decreased Gait & Station: unable to stand Patient leans: N/A  Psychiatric Specialty Exam: Physical Exam  Nursing note and vitals reviewed. Constitutional: She appears well-developed and well-nourished.  HENT:  Head: Normocephalic and atraumatic.  Eyes: Pupils are equal, round, and reactive to light. Conjunctivae are normal.  Neck: Normal range of motion.  Cardiovascular: Normal heart sounds.   Respiratory: Effort normal.  GI: Soft.  Musculoskeletal: Normal range of motion.  Neurological: She is alert.  Skin: Skin is warm and dry.     Psychiatric:  Patient is currently knocked out from medication and not interactive    Review of Systems  Unable to perform ROS: Patient unresponsive    Blood pressure (!) 118/101, pulse (!) 120, temperature (!) 97.5 F (36.4 C), temperature source Oral, resp. rate (!) 26, SpO2 100 %.There is no height or weight on file to calculate BMI.  General Appearance: Disheveled  Eye Contact:  None  Speech:  Negative  Volume:  Decreased  Mood:  Negative  Affect:  Negative  Thought Process:  NA  Orientation:  Negative  Thought Content:  Negative  Suicidal Thoughts:  No  Homicidal Thoughts:  No  Memory:  Negative  Judgement:  Negative  Insight:  Negative  Psychomotor Activity:  Negative  Concentration:  Concentration: Negative  Recall:  Negative  Fund of Knowledge:  Negative  Language:  Negative  Akathisia:  Negative  Handed:  Right  AIMS (if indicated):     Assets:  Physical Health  ADL's:  Impaired  Cognition:  Impaired,  Moderate    Sleep:        Treatment Plan Summary: Plan Patient typically would not require specific psychiatric medicine. No need to add any new prescriptions at this point. We will try to reassess her over the weekend. Patient currently will be monitored. Try to get a drug screen done although she has already stated she is using cocaine. Continue IVC until we can face-to-face confirm that she is safe and approve a disposition  Disposition: Patient does not meet criteria for psychiatric inpatient admission.  Alethia Berthold, MD 06/13/2017 8:28 PM

## 2017-06-13 NOTE — ED Triage Notes (Signed)
Pt brought in by BP voluntary for SI. Pt admits that she used cocaine 2 days ago. Pt denies plan for killing herself but states that she has tried several times in the past and that she would "have figured it out".

## 2017-06-13 NOTE — ED Notes (Signed)
Pt refused to allow this tech to get vital signs

## 2017-06-13 NOTE — ED Provider Notes (Signed)
[facility] Emergency Department Provider Note    First MD Initiated Contact with Patient 06/13/17 1751     (approximate)  I have reviewed the triage vital signs and the nursing notes.   HISTORY  Chief Complaint Psychiatric Evaluation  Level 5 Caveat:  Toxic encephalopathy  HPI Rebecca Mcclure is a 31 y.o. female with a history of substance abuse and bipolar disorder presents from [facility] police department under IVC with report of agitation and abnormal behavior and suicidal ideation. Patient states that she can't think of any specific plan but wants to be dead.  patient is also complaining of pain on her right ring finger and appears to have a felon there. States that all of her thoughts of harming herself started when her sister moved back into the house and she had to leave.  Past Medical History:  Diagnosis Date  . Asthma   . Bipolar 1 disorder (HCC)   . Drug addiction (HCC)    No family history on file. Past Surgical History:  Procedure Laterality Date  . GANGLION CYST EXCISION    . NOSE SURGERY     Patient Active Problem List   Diagnosis Date Noted  . Biliary colic 04/28/2017  . Cocaine abuse 11/05/2016  . Substance induced mood disorder (HCC) 11/05/2016  . Substance induced mood disorder (HCC) 02/06/2016  . Cocaine abuse 02/06/2016  . Marijuana abuse 02/06/2016  . Noncompliance 02/06/2016  . 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    Social History Social History  Substance Use Topics  . Smoking status: Current Every Day Smoker    Packs/day: 1.00    Types: Cigarettes  . Smokeless tobacco: Never Used  . Alcohol use No    Review of Systems Patient denies headaches, rhinorrhea, blurry vision, numbness, shortness of breath, chest pain, edema, cough, abdominal pain, nausea, vomiting, diarrhea, dysuria, fevers, rashes or  hallucinations unless otherwise stated above in HPI. ____________________________________________   PHYSICAL EXAM:  VITAL SIGNS: Vitals:   06/13/17 1659  BP: (!) 118/101  Pulse: (!) 120  Resp: (!) 26  Temp: (!) 97.5 F (36.4 C)  SpO2: 100%    Constitutional:intoxicated, MAE, protecting her airway, eating a lunch tray Eyes: Conjunctivae are normal.  Head: Atraumatic. Nose: No congestion/rhinnorhea. Mouth/Throat: Mucous membranes are moist.   Neck: No stridor. Painless ROM.  Cardiovascular: mildly tachycardic, regular rhythm. Grossly normal heart sounds.  Good peripheral circulation. Respiratory: Normal respiratory effort.  No retractions. Lungs CTAB. Gastrointestinal: Soft and nontender. No distention. No abdominal bruits. No CVA tenderness. Genitourinary:  Musculoskeletal: No lower extremity tenderness nor edema.  No joint effusions.  Paronychia to right ring finger Neurologic:  No gross focal neurologic deficits are appreciated. No facial droop Skin:  Skin is warm, dry and intact. No rash noted. Psychiatric: pressured speech, intoxicated, +SI ____________________________________________   LABS (all labs ordered are listed, but only abnormal results are displayed)  Results for orders placed or performed during the hospital encounter of 06/13/17 (from the past 24 hour(s))  Comprehensive metabolic panel     Status: Abnormal   Collection Time: 06/13/17  5:13 PM  Result Value Ref Range   Sodium 130 (L) 135 - 145 mmol/L   Potassium 3.3 (L) 3.5 - 5.1 mmol/L   Chloride 99 (L) 101 - 111 mmol/L   CO2 21 (L) 22 - 32 mmol/L   Glucose,  Bld 118 (H) 65 - 99 mg/dL   BUN 22 (H) 6 - 20 mg/dL   Creatinine, Ser 1.61 0.44 - 1.00 mg/dL   Calcium 9.3 8.9 - 09.6 mg/dL   Total Protein 8.0 6.5 - 8.1 g/dL   Albumin 4.4 3.5 - 5.0 g/dL   AST 045 (H) 15 - 41 U/L   ALT 123 (H) 14 - 54 U/L   Alkaline Phosphatase 66 38 - 126 U/L   Total Bilirubin 0.9 0.3 - 1.2 mg/dL   GFR calc non Af Amer >60  >60 mL/min   GFR calc Af Amer >60 >60 mL/min   Anion gap 10 5 - 15  Ethanol     Status: None   Collection Time: 06/13/17  5:13 PM  Result Value Ref Range   Alcohol, Ethyl (B) <5 <5 mg/dL  Salicylate level     Status: None   Collection Time: 06/13/17  5:13 PM  Result Value Ref Range   Salicylate Lvl <7.0 2.8 - 30.0 mg/dL  Acetaminophen level     Status: None   Collection Time: 06/13/17  5:13 PM  Result Value Ref Range   Acetaminophen (Tylenol), Serum 30 10 - 30 ug/mL  cbc     Status: Abnormal   Collection Time: 06/13/17  5:13 PM  Result Value Ref Range   WBC 15.4 (H) 3.6 - 11.0 K/uL   RBC 4.07 3.80 - 5.20 MIL/uL   Hemoglobin 13.0 12.0 - 16.0 g/dL   HCT 40.9 81.1 - 91.4 %   MCV 93.0 80.0 - 100.0 fL   MCH 31.9 26.0 - 34.0 pg   MCHC 34.3 32.0 - 36.0 g/dL   RDW 78.2 95.6 - 21.3 %   Platelets 384 150 - 440 K/uL   ____________________________________________  ____________________________________________  ____________________________________________   PROCEDURES  Procedure(s) performed:  Marland KitchenMarland KitchenIncision and Drainage Date/Time: 06/13/2017 6:00 PM Performed by: Willy Eddy Authorized by: Willy Eddy   Consent:    Consent obtained:  Verbal Location:    Location:  Upper extremity   Upper extremity location:  Finger   Finger location:  R ring finger Pre-procedure details:    Skin preparation:  Betadine Anesthesia (see MAR for exact dosages):    Anesthesia method:  Local infiltration   Local anesthetic:  Bupivacaine 0.25% w/o epi Procedure type:    Complexity:  Simple Procedure details:    Incision types:  Stab incision   Scalpel blade:  11   Drainage:  Purulent   Drainage amount:  Moderate Post-procedure details:    Patient tolerance of procedure:  Tolerated well, no immediate complications      Critical Care performed: no ____________________________________________   INITIAL IMPRESSION / ASSESSMENT AND PLAN / ED COURSE  Pertinent labs & imaging  results that were available during my care of the patient were reviewed by me and considered in my medical decision making (see chart for details).  DDX: Psychosis, delirium, medication effect, noncompliance, polysubstance abuse, Si, Hi, depression    Rebecca Mcclure is a 31 y.o. who presents to the ED with for evaluation of SI and substance abuse.  Also with complaint of paronychia as described above.  I and D performed as above..  Patient has psych history of substance abuse.  Laboratory testing was ordered to evaluation for underlying electrolyte derangement or signs of underlying organic pathology to explain today's presentation.  Based on history and physical and laboratory evaluation, it appears that the patient's presentation is 2/2 underlying psychiatric disorder and will require further  evaluation and management by inpatient psychiatry.  Patient was  made an IVC due to substance abuse and suicide .  Disposition pending psychiatric evaluation.       ____________________________________________   FINAL CLINICAL IMPRESSION(S) / ED DIAGNOSES  Final diagnoses:  Suicidal ideation  Substance abuse  Paronychia of finger, right      NEW MEDICATIONS STARTED DURING THIS VISIT:  New Prescriptions   No medications on file     Note:  This document was prepared using Dragon voice recognition software and may include unintentional dictation errors.    Willy Eddy, MD 06/14/17 [phone]

## 2017-06-13 NOTE — ED Notes (Signed)
Patient states "My sister has turned my parents against me.  My parents have thrown me out of the house and I have no where to go"  Patient denies plan to kill self but states "I might as well, no one wants me anymore".  Admits to cocaine use 2 days ago, denies using other drugs or alcohol.

## 2017-06-13 NOTE — BH Assessment (Signed)
Assessment Note  Rebecca Mcclure is an 31 y.o. female presenting to the ED under IVC for suicidal ideations with no plan and substance use disorder.  Pt denies SI while in the ED.  History limited due to patient's sedation.  According to the chart review, patient was thrown out of her parents' house and has no place to go.  Patient made suicidal statements in the presence of family members.  Diagnosis: Substance induced mood disorder  Past Medical History:  Past Medical History:  Diagnosis Date  . Asthma   . Bipolar 1 disorder (HCC)   . Drug addiction [facility])     Past Surgical History:  Procedure Laterality Date  . GANGLION CYST EXCISION    . NOSE SURGERY      Family History: No family history on file.  Social History:  reports that she has been smoking Cigarettes.  She has been smoking about 1.00 pack per day. She has never used smokeless tobacco. She reports that she uses drugs, including Marijuana and Cocaine. She reports that she does not drink alcohol.  Additional Social History:  Alcohol / Drug Use Pain Medications: See PTA Prescriptions: See PTA Over the Counter: See PTA History of alcohol / drug use?: Yes Negative Consequences of Use: Personal relationships, Legal Withdrawal Symptoms: Agitation Substance #1 Name of Substance 1: Crack cocaine 1 - Age of First Use: 19  CIWA: CIWA-Ar BP: (!) 118/101 Pulse Rate: (!) 120 COWS:    Allergies: No Known Allergies  Home Medications:  (Not in a hospital admission)  OB/GYN Status:  No LMP recorded.  General Assessment Data Assessment unable to be completed: Yes Reason for not completing assessment: Hx limited be patient's sedation Location of Assessment: [facility] ED TTS Assessment: In system Is this a Tele or Face-to-Face Assessment?: Face-to-Face Is this an Initial Assessment or a Re-assessment for this encounter?: Initial Assessment Marital status: Married Sylvia name: n/a Is patient pregnant?: No Pregnancy Status:  No Living Arrangements: Parent Can pt return to current living arrangement?: Yes Admission Status: Involuntary Is patient capable of signing voluntary admission?: No Referral Source: Self/Family/Friend Insurance type: None     Crisis Care Plan Living Arrangements: Parent Legal Guardian: Other: (self) Name of Psychiatrist: unknown Name of Therapist: unknown  Education Status Is patient currently in school?: No Current Grade: n/a Highest grade of school patient has completed: unknown Name of school: na Contact person: na  Risk to self with the past 6 months Suicidal Ideation: No Has patient been a risk to self within the past 6 months prior to admission? : No Suicidal Intent: No Has patient had any suicidal intent within the past 6 months prior to admission? : No Is patient at risk for suicide?: No Suicidal Plan?: No Has patient had any suicidal plan within the past 6 months prior to admission? : No Access to Means: No What has been your use of drugs/alcohol within the last 12 months?: cocaine Previous Attempts/Gestures: No How many times?: 0 Other Self Harm Risks: drug addiction Triggers for Past Attempts: Unknown Intentional Self Injurious Behavior: None Family Suicide History: Unknown Recent stressful life event(s): Other (Comment) Persecutory voices/beliefs?: No Depression: No Substance abuse history and/or treatment for substance abuse?: Yes Suicide prevention information given to non-admitted patients: Not applicable  Risk to Others within the past 6 months Homicidal Ideation: No Does patient have any lifetime risk of violence toward others beyond the six months prior to admission? : No Thoughts of Harm to Others: No Current Homicidal Intent:  No Current Homicidal Plan: No Access to Homicidal Means: No Identified Victim: none identified History of harm to others?: No Assessment of Violence: None Noted Violent Behavior Description: none identified Does  patient have access to weapons?: No Criminal Charges Pending?: No Does patient have a court date: No Is patient on probation?: No  Psychosis Hallucinations: None noted Delusions: None noted  Mental Status Report Appearance/Hygiene: In scrubs Eye Contact: Unable to Assess Motor Activity: Freedom of movement Speech: Unable to assess Level of Consciousness: Sedated, Sleeping Mood: Other (Comment) Affect: Unable to Assess Anxiety Level: Minimal Thought Processes: Unable to Assess Judgement: Impaired Orientation: Not oriented Obsessive Compulsive Thoughts/Behaviors: Unable to Assess  Cognitive Functioning Concentration: Unable to Assess Memory: Unable to Assess IQ: Average Insight: Unable to Assess Impulse Control: Unable to Assess Appetite: Poor Sleep: Unable to Assess Vegetative Symptoms: None  ADLScreening [facility] Assessment Services) Patient's cognitive ability adequate to safely complete daily activities?: Yes Patient able to express need for assistance with ADLs?: Yes Independently performs ADLs?: Yes (appropriate for developmental age)  Prior Inpatient Therapy Prior Inpatient Therapy: No Prior Therapy Dates: na Prior Therapy Facilty/Provider(s): na Reason for Treatment: na  Prior Outpatient Therapy Prior Outpatient Therapy: No Prior Therapy Dates: na Prior Therapy Facilty/Provider(s): na Reason for Treatment: na Does patient have an ACCT team?: Unknown Does patient have Intensive In-House Services?  : Unknown Does patient have [facility] services? : Unknown Does patient have P4CC services?: Unknown  ADL Screening (condition at time of admission) Patient's cognitive ability adequate to safely complete daily activities?: Yes Patient able to express need for assistance with ADLs?: Yes Independently performs ADLs?: Yes (appropriate for developmental age)       Abuse/Neglect Assessment (Assessment to be complete while patient is alone) Physical Abuse:  Denies Verbal Abuse: Denies Sexual Abuse: Denies Exploitation of patient/patient's resources: Denies Self-Neglect: Denies Values / Beliefs Cultural Requests During Hospitalization: None Spiritual Requests During Hospitalization: None Consults Spiritual Care Consult Needed: No Social Work Consult Needed: No Merchant navy officer (For Healthcare) Does Patient Have a Medical Advance Directive?: No Would patient like information on creating a medical advance directive?: No - Patient declined    Additional Information 1:1 In Past 12 Months?: No CIRT Risk: Yes Elopement Risk: No Does patient have medical clearance?: Yes     Disposition:  Disposition Initial Assessment Completed for this Encounter: Yes Disposition of Patient: Outpatient treatment Type of outpatient treatment: Chemical Dependence - Intensive Outpatient  On Site Evaluation by:   Reviewed with Physician:    [place] 06/13/2017 11:56 PM

## 2017-06-14 LAB — URINE DRUG SCREEN, QUALITATIVE (ARMC ONLY)
Amphetamines, Ur Screen: NOT DETECTED
BARBITURATES, UR SCREEN: NOT DETECTED
Benzodiazepine, Ur Scrn: NOT DETECTED
CANNABINOID 50 NG, UR ~~LOC~~: POSITIVE — AB
Cocaine Metabolite,Ur ~~LOC~~: POSITIVE — AB
MDMA (ECSTASY) UR SCREEN: NOT DETECTED
Methadone Scn, Ur: NOT DETECTED
Opiate, Ur Screen: NOT DETECTED
PHENCYCLIDINE (PCP) UR S: NOT DETECTED
TRICYCLIC, UR SCREEN: NOT DETECTED

## 2017-06-14 NOTE — ED Notes (Signed)
BEHAVIORAL HEALTH ROUNDING Patient sleeping: Yes.   Patient alert and oriented: not applicable SLEEPING Behavior appropriate: Yes.  ; If no, describe: SLEEPING Nutrition and fluids offered: No SLEEPING Toileting and hygiene offered: NoSLEEPING Sitter present: not applicable, Q 15 min safety rounds and observation. Law enforcement present: Yes ODS 

## 2017-06-14 NOTE — ED Notes (Signed)
BEHAVIORAL HEALTH ROUNDING Patient sleeping: Yes.   Patient alert and oriented: not applicable Behavior appropriate: Yes.  ; If no, describe:  Nutrition and fluids offered: Yes  Toileting and hygiene offered: Yes  Sitter present: not applicable Law enforcement present: Yes  

## 2017-06-14 NOTE — ED Notes (Signed)
BEHAVIORAL HEALTH ROUNDING  Patient sleeping: No.  Patient alert and oriented: yes  Behavior appropriate: Yes. ; If no, describe:  Nutrition and fluids offered: Yes  Toileting and hygiene offered: Yes  Sitter present: not applicable, Q 15 min safety rounds and observation.  Law enforcement present: Yes ODS  ED BHU PLACEMENT JUSTIFICATION  Is the patient under IVC or is there intent for IVC: Yes.  Is the patient medically cleared: Yes.  Is there vacancy in the ED BHU: Yes.  Is the population mix appropriate for patient: Yes.  Is the patient awaiting placement in inpatient or outpatient setting: Yes.  Has the patient had a psychiatric consult: Yes.  Survey of unit performed for contraband, proper placement and condition of furniture, tampering with fixtures in bathroom, shower, and each patient room: Yes. ; Findings: All clear  APPEARANCE/BEHAVIOR  calm, cooperative and adequate rapport can be established  NEURO ASSESSMENT  Orientation: time, place and person  Hallucinations: No.None noted (Hallucinations)  Speech: Normal  Gait: normal  RESPIRATORY ASSESSMENT  WNL  CARDIOVASCULAR ASSESSMENT  WNL  GASTROINTESTINAL ASSESSMENT  WNL  EXTREMITIES  WNL  PLAN OF CARE  Provide calm/safe environment. Vital signs assessed TID. ED BHU Assessment once each 12-hour shift. Collaborate with TTS daily or as condition indicates. Assure the ED provider has rounded once each shift. Provide and encourage hygiene. Provide redirection as needed. Assess for escalating behavior; address immediately and inform ED provider.  Assess family dynamic and appropriateness for visitation as needed: Yes. ; If necessary, describe findings:  Educate the patient/family about BHU procedures/visitation: Yes. ; If necessary, describe findings: Pt is calm and cooperative at this time. Pt understanding and accepting of unit procedures/rules. Will continue to monitor with Q 15 min safety rounds and observation via security  camera.

## 2017-06-14 NOTE — ED Notes (Signed)
Pt asleep lying in bed. Pt was offered a sandwich tray and a sprite. Pt woke up and accepted such; Nothing needed from staff at this time.

## 2017-06-14 NOTE — ED Notes (Signed)

## 2017-06-14 NOTE — ED Notes (Signed)
Pt calm and lying in bed. Pt was offered a snack tray and a beverage and gladly accepted both. Pt was given another blanket and drink per request. Pt now resting in bed asleep. Nothing needed from staff at this time

## 2017-06-14 NOTE — ED Notes (Signed)
BEHAVIORAL HEALTH ROUNDING Patient sleeping: Yes.   Patient alert and oriented: not applicable Behavior appropriate: Yes.  ; If no, describe:  Nutrition and fluids offered: No Toileting and hygiene offered: No Sitter present: not applicable Law enforcement present: Yes  

## 2017-06-14 NOTE — ED Notes (Signed)

## 2017-06-15 NOTE — ED Notes (Signed)
BEHAVIORAL HEALTH ROUNDING Patient sleeping: Yes.   Patient alert and oriented: not applicable SLEEPING Behavior appropriate: Yes.  ; If no, describe: SLEEPING Nutrition and fluids offered: No SLEEPING Toileting and hygiene offered: NoSLEEPING Sitter present: not applicable, Q 15 min safety rounds and observation. Law enforcement present: Yes ODS 

## 2017-06-15 NOTE — Consult Note (Signed)
[facility] Face-to-Face Psychiatry Consult   Reason for Consult:  Consult to follow-up with 31 year old woman brought into the hospital a couple days ago very agitated at that time. Referring Physician:  Burlene Arnt Patient Identification: Rebecca Mcclure MRN:  [id number] Principal Diagnosis: Substance induced mood disorder [facility]) Diagnosis:   Patient Active Problem List   Diagnosis Date Noted  . Biliary colic [V76.16] 07/37/1062  . Cocaine abuse [F14.10] 11/05/2016  . Substance induced mood disorder ([place]) [F19.94] 11/05/2016  . Substance induced mood disorder ([place]) [F19.94] 02/06/2016  . Cocaine abuse [F14.10] 02/06/2016  . Marijuana abuse [F12.10] 02/06/2016  . Noncompliance [Z91.19] 02/06/2016  . 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 female patient admitted with "I don't have any place to live".  HPI:  Patient today is finally awake enough for me to talk with her. She says that she came into the hospital because her parents threw her out of the house. When she tried to go back and asked to come home they called the police on her. Patient admits that she is been using cocaine daily and heavily for quite a while. Last sobriety was over a year ago. She currently has no other place to stay. Patient had not been eating well outside the hospital not sleeping well mood and been irritable angry and depressed and confused. Patient denies having any suicidal ideation or homicidal ideation. Today says she is just feeling a little run down and irritated. She is not currently prescribed any medicine outside the hospital. Had not been getting any outpatient treatment. She says that a big stress for her is that her husband is currently in a rehabilitation program. She expresses a wish to get into rehabilitation.  Social history: Married no children. Husband in rehabilitation. Patient has no place to  live.  Medical history: Past history of biliary colic really no significant ongoing medical problems  Substance abuse history: Long-standing cocaine abuse going back 15 or more years. Has had up to 2 years of sobriety in the past with treatment. Denies use recently of any other drugs.  Past Psychiatric History: Mood problems primarily related to substance abuse. Says that she used to be a cutter however and has tried to kill her self although was years ago. Medications had been tried without much benefit that she can see.  Risk to Self: Suicidal Ideation: No Suicidal Intent: No Is patient at risk for suicide?: No Suicidal Plan?: No Access to Means: No What has been your use of drugs/alcohol within the last 12 months?: cocaine How many times?: 0 Other Self Harm Risks: drug addiction Triggers for Past Attempts: Unknown Intentional Self Injurious Behavior: None Risk to Others: Homicidal Ideation: No Thoughts of Harm to Others: No Current Homicidal Intent: No Current Homicidal Plan: No Access to Homicidal Means: No Identified Victim: none identified History of harm to others?: No Assessment of Violence: None Noted Violent Behavior Description: none identified Does patient have access to weapons?: No Criminal Charges Pending?: No Does patient have a court date: No Prior Inpatient Therapy: Prior Inpatient Therapy: No Prior Therapy Dates: na Prior Therapy Facilty/Provider(s): na Reason for Treatment: na Prior Outpatient Therapy: Prior Outpatient Therapy: No Prior Therapy Dates: na Prior Therapy Facilty/Provider(s): na Reason for Treatment: na Does patient have an ACCT team?: Unknown Does patient have Intensive In-House Services?  : Unknown Does patient have [facility] services? : Unknown Does patient have  P4CC services?: Unknown  Past Medical History:  Past Medical History:  Diagnosis Date  . Asthma   . Bipolar 1 disorder ([place])   . Drug addiction [facility])     Past Surgical  History:  Procedure Laterality Date  . GANGLION CYST EXCISION    . NOSE SURGERY     Family History: No family history on file. Family Psychiatric  History: Positive for substance abuse in several members of the family Social History:  History  Alcohol Use No     History  Drug Use  . Types: Marijuana, Cocaine    Comment: Last used cocaine 2 days ago    Social History   Social History  . Marital status: Married    Spouse name: N/A  . Number of children: N/A  . Years of education: N/A   Social History Main Topics  . Smoking status: Current Every Day Smoker    Packs/day: 1.00    Types: Cigarettes  . Smokeless tobacco: Never Used  . Alcohol use No  . Drug use: Yes    Types: Marijuana, Cocaine     Comment: Last used cocaine 2 days ago  . Sexual activity: Yes    Birth control/ protection: None   Other Topics Concern  . None   Social History Narrative   ** Merged History Encounter **       Additional Social History:    Allergies:  No Known Allergies  Labs:  Results for orders placed or performed during the hospital encounter of 06/13/17 (from the past 48 hour(s))  Comprehensive metabolic panel     Status: Abnormal   Collection Time: 06/13/17  5:13 PM  Result Value Ref Range   Sodium 130 (L) 135 - 145 mmol/L   Potassium 3.3 (L) 3.5 - 5.1 mmol/L   Chloride 99 (L) 101 - 111 mmol/L   CO2 21 (L) 22 - 32 mmol/L   Glucose, Bld 118 (H) 65 - 99 mg/dL   BUN 22 (H) 6 - 20 mg/dL   Creatinine, Ser 0.97 0.44 - 1.00 mg/dL   Calcium 9.3 8.9 - 10.3 mg/dL   Total Protein 8.0 6.5 - 8.1 g/dL   Albumin 4.4 3.5 - 5.0 g/dL   AST 100 (H) 15 - 41 U/L   ALT 123 (H) 14 - 54 U/L   Alkaline Phosphatase 66 38 - 126 U/L   Total Bilirubin 0.9 0.3 - 1.2 mg/dL   GFR calc non Af Amer >60 >60 mL/min   GFR calc Af Amer >60 >60 mL/min    Comment: (NOTE) The eGFR has been calculated using the CKD EPI equation. This calculation has not been validated in all clinical situations. eGFR's  persistently <60 mL/min signify possible Chronic Kidney Disease.    Anion gap 10 5 - 15  Ethanol     Status: None   Collection Time: 06/13/17  5:13 PM  Result Value Ref Range   Alcohol, Ethyl (B) <5 <5 mg/dL    Comment:        LOWEST DETECTABLE LIMIT FOR SERUM ALCOHOL IS 5 mg/dL FOR MEDICAL PURPOSES ONLY   Salicylate level     Status: None   Collection Time: 06/13/17  5:13 PM  Result Value Ref Range   Salicylate Lvl <9.7 2.8 - 30.0 mg/dL  Acetaminophen level     Status: None   Collection Time: 06/13/17  5:13 PM  Result Value Ref Range   Acetaminophen (Tylenol), Serum 30 10 - 30 ug/mL    Comment:  THERAPEUTIC CONCENTRATIONS VARY SIGNIFICANTLY. A RANGE OF 10-30 ug/mL MAY BE AN EFFECTIVE CONCENTRATION FOR MANY PATIENTS. HOWEVER, SOME ARE BEST TREATED AT CONCENTRATIONS OUTSIDE THIS RANGE. ACETAMINOPHEN CONCENTRATIONS >150 ug/mL AT 4 HOURS AFTER INGESTION AND >50 ug/mL AT 12 HOURS AFTER INGESTION ARE OFTEN ASSOCIATED WITH TOXIC REACTIONS.   cbc     Status: Abnormal   Collection Time: 06/13/17  5:13 PM  Result Value Ref Range   WBC 15.4 (H) 3.6 - 11.0 K/uL   RBC 4.07 3.80 - 5.20 MIL/uL   Hemoglobin 13.0 12.0 - 16.0 g/dL   HCT 37.9 35.0 - 47.0 %   MCV 93.0 80.0 - 100.0 fL   MCH 31.9 26.0 - 34.0 pg   MCHC 34.3 32.0 - 36.0 g/dL   RDW 13.7 11.5 - 14.5 %   Platelets 384 150 - 440 K/uL  hCG, quantitative, pregnancy     Status: None   Collection Time: 06/13/17  5:13 PM  Result Value Ref Range   hCG, Beta Chain, Quant, S 1 <5 mIU/mL    Comment:          GEST. AGE      CONC.  (mIU/mL)   <=1 WEEK        5 - 50     2 WEEKS       50 - 500     3 WEEKS       100 - 10,000     4 WEEKS     1,000 - 30,000     5 WEEKS     3,500 - 115,000   6-8 WEEKS     12,000 - 270,000    12 WEEKS     15,000 - 220,000        FEMALE AND NON-PREGNANT FEMALE:     LESS THAN 5 mIU/mL   Urine Drug Screen, Qualitative     Status: Abnormal   Collection Time: 06/13/17  5:14 PM  Result Value Ref  Range   Tricyclic, Ur Screen NONE DETECTED NONE DETECTED   Amphetamines, Ur Screen NONE DETECTED NONE DETECTED   MDMA (Ecstasy)Ur Screen NONE DETECTED NONE DETECTED   Cocaine Metabolite,Ur [place] POSITIVE (A) NONE DETECTED   Opiate, Ur Screen NONE DETECTED NONE DETECTED   Phencyclidine (PCP) Ur S NONE DETECTED NONE DETECTED   Cannabinoid 50 Ng, Ur [place] POSITIVE (A) NONE DETECTED   Barbiturates, Ur Screen NONE DETECTED NONE DETECTED   Benzodiazepine, Ur Scrn NONE DETECTED NONE DETECTED   Methadone Scn, Ur NONE DETECTED NONE DETECTED    Comment: (NOTE) 098  Tricyclics, urine               Cutoff 1000 ng/mL 200  Amphetamines, urine             Cutoff 1000 ng/mL 300  MDMA (Ecstasy), urine           Cutoff 500 ng/mL 400  Cocaine Metabolite, urine       Cutoff 300 ng/mL 500  Opiate, urine                   Cutoff 300 ng/mL 600  Phencyclidine (PCP), urine      Cutoff 25 ng/mL 700  Cannabinoid, urine              Cutoff 50 ng/mL 800  Barbiturates, urine             Cutoff 200 ng/mL 900  Benzodiazepine, urine  Cutoff 200 ng/mL 1000 Methadone, urine                Cutoff 300 ng/mL 1100 1200 The urine drug screen provides only a preliminary, unconfirmed 1300 analytical test result and should not be used for non-medical 1400 purposes. Clinical consideration and professional judgment should 1500 be applied to any positive drug screen result due to possible 1600 interfering substances. A more specific alternate chemical method 1700 must be used in order to obtain a confirmed analytical result.  1800 Gas chromato graphy / mass spectrometry (GC/MS) is the preferred 1900 confirmatory method.     No current facility-administered medications for this encounter.    No current outpatient prescriptions on file.    Musculoskeletal: Strength & Muscle Tone: within normal limits Gait & Station: normal Patient leans: N/A  Psychiatric Specialty Exam: Physical Exam  Nursing note and vitals  reviewed. Constitutional: She appears well-developed and well-nourished.  HENT:  Head: Normocephalic and atraumatic.  Eyes: Pupils are equal, round, and reactive to light. Conjunctivae are normal.  Neck: Normal range of motion.  Cardiovascular: Regular rhythm and normal heart sounds.   Respiratory: Effort normal. No respiratory distress.  GI: Soft.  Musculoskeletal: Normal range of motion.  Neurological: She is alert.  Skin: Skin is warm and dry.  Psychiatric: Her speech is normal and behavior is normal. Judgment normal. Her affect is blunt. Thought content is not paranoid. She expresses no homicidal and no suicidal ideation. She exhibits abnormal recent memory.    Review of Systems  Constitutional: Negative.   HENT: Negative.   Eyes: Negative.   Respiratory: Negative.   Cardiovascular: Negative.   Gastrointestinal: Negative.   Musculoskeletal: Negative.   Skin: Negative.   Neurological: Negative.   Psychiatric/Behavioral: Positive for memory loss and substance abuse. Negative for depression, hallucinations and suicidal ideas. The patient is nervous/anxious. The patient does not have insomnia.     Blood pressure 104/63, pulse 76, temperature 98.2 F (36.8 C), temperature source Oral, resp. rate 18, SpO2 99 %.There is no height or weight on file to calculate BMI.  General Appearance: Disheveled  Eye Contact:  Fair  Speech:  Normal Rate  Volume:  Normal  Mood:  Dysphoric  Affect:  Congruent  Thought Process:  Goal Directed  Orientation:  Full (Time, Place, and Person)  Thought Content:  Logical  Suicidal Thoughts:  No  Homicidal Thoughts:  No  Memory:  Immediate;   Good Recent;   Fair Remote;   Fair  Judgement:  Fair  Insight:  Fair  Psychomotor Activity:  Normal  Concentration:  Concentration: Fair  Recall:  [employer] of Knowledge:  Fair  Language:  Fair  Akathisia:  No  Handed:  Right  AIMS (if indicated):     Assets:  Desire for Improvement Resilience  ADL's:   Intact  Cognition:  WNL  Sleep:        Treatment Plan Summary: Plan 31 year old woman with a history of cocaine abuse and substance-induced mood disorder currently homeless. She is now woken up from the sedation that was applied. She is not reporting psychotic symptoms not reporting suicidality. Appears to be able to take care of herself and has good insight into her condition. Patient no longer meets commitment criteria and so I'll discontinue that. She can be released from the emergency room. She is familiar with local treatment programs and says she intends to get into one that we'll possibly allow her to be in contact with her husband. Case  reviewed with emergency room physician and nursing discontinue IVC.  Disposition: Patient does not meet criteria for psychiatric inpatient admission. Supportive therapy provided about ongoing stressors.  Alethia Berthold, MD 06/15/2017 3:26 PM

## 2017-06-15 NOTE — ED Provider Notes (Signed)
-----------------------------------------   7:15 AM on 06/15/2017 -----------------------------------------   Blood pressure 95/75, pulse 91, temperature 98 F (36.7 C), temperature source Oral, resp. rate 15, SpO2 99 %.  The patient had no acute events since last update.  Calm and cooperative at this time.  Disposition is pending Psychiatry/Behavioral Medicine team recommendations.     Jeanmarie Plant, MD 06/15/17 [phone]

## 2017-06-15 NOTE — ED Notes (Signed)
Pt states  " I have place to go now if I can go today" Informed patient that I was not sure of what her POC was at this time, but would notify the MD.

## 2017-06-15 NOTE — ED Provider Notes (Signed)
Dr. Toni Amend recommends discharge.   Merrily Brittle, MD 06/15/17 [phone]

## 2017-06-15 NOTE — Discharge Instructions (Signed)
please follow-up with your psychiatrist as scheduled. Return to the emergency department for any concerns.

## 2017-09-01 ENCOUNTER — Emergency Department
Admission: EM | Admit: 2017-09-01 | Discharge: 2017-09-01 | Disposition: A | Discharge status: Home / Self Care | Payer: Self-pay | Attending: Emergency Medicine | Admitting: Emergency Medicine

## 2017-09-01 DIAGNOSIS — F1910 Other psychoactive substance abuse, uncomplicated: Secondary | ICD-10-CM | POA: Insufficient documentation

## 2017-09-01 DIAGNOSIS — F17210 Nicotine dependence, cigarettes, uncomplicated: Secondary | ICD-10-CM | POA: Insufficient documentation

## 2017-09-01 DIAGNOSIS — J45909 Unspecified asthma, uncomplicated: Secondary | ICD-10-CM | POA: Insufficient documentation

## 2017-09-01 LAB — CBC
HCT: 36.8 % (ref 35.0–47.0)
HEMOGLOBIN: 12.6 g/dL (ref 12.0–16.0)
MCH: 32.1 pg (ref 26.0–34.0)
MCHC: 34.1 g/dL (ref 32.0–36.0)
MCV: 94.2 fL (ref 80.0–100.0)
PLATELETS: 359 10*3/uL (ref 150–440)
RBC: 3.91 MIL/uL (ref 3.80–5.20)
RDW: 14.2 % (ref 11.5–14.5)
WBC: 9.9 10*3/uL (ref 3.6–11.0)

## 2017-09-01 LAB — COMPREHENSIVE METABOLIC PANEL
ALBUMIN: 3.5 g/dL (ref 3.5–5.0)
ALK PHOS: 57 U/L (ref 38–126)
ALT: 16 U/L (ref 14–54)
ANION GAP: 8 (ref 5–15)
AST: 20 U/L (ref 15–41)
BILIRUBIN TOTAL: 0.4 mg/dL (ref 0.3–1.2)
BUN: 18 mg/dL (ref 6–20)
CALCIUM: 9.3 mg/dL (ref 8.9–10.3)
CO2: 28 mmol/L (ref 22–32)
Chloride: 105 mmol/L (ref 101–111)
Creatinine, Ser: 0.74 mg/dL (ref 0.44–1.00)
GLUCOSE: 90 mg/dL (ref 65–99)
POTASSIUM: 3.5 mmol/L (ref 3.5–5.1)
Sodium: 141 mmol/L (ref 135–145)
TOTAL PROTEIN: 7.3 g/dL (ref 6.5–8.1)

## 2017-09-01 LAB — URINE DRUG SCREEN, QUALITATIVE (ARMC ONLY)
AMPHETAMINES, UR SCREEN: NOT DETECTED
Barbiturates, Ur Screen: NOT DETECTED
Benzodiazepine, Ur Scrn: POSITIVE — AB
CANNABINOID 50 NG, UR ~~LOC~~: POSITIVE — AB
COCAINE METABOLITE, UR ~~LOC~~: POSITIVE — AB
MDMA (ECSTASY) UR SCREEN: NOT DETECTED
METHADONE SCREEN, URINE: NOT DETECTED
Opiate, Ur Screen: NOT DETECTED
Phencyclidine (PCP) Ur S: NOT DETECTED
TRICYCLIC, UR SCREEN: NOT DETECTED

## 2017-09-01 LAB — SALICYLATE LEVEL

## 2017-09-01 LAB — POCT PREGNANCY, URINE: PREG TEST UR: NEGATIVE

## 2017-09-01 LAB — ETHANOL

## 2017-09-01 LAB — ACETAMINOPHEN LEVEL

## 2017-09-01 MED ORDER — NALOXONE HCL 4 MG/0.1ML NA LIQD
NASAL | Status: AC
  Filled 2017-09-01: qty 4

## 2017-09-01 MED ORDER — TRAMADOL HCL 50 MG PO TABS: 50.0000 mg | ORAL_TABLET | Freq: Four times a day (QID) | ORAL | 0 refills | Status: DC | PRN

## 2017-09-01 MED ORDER — LIDOCAINE 5 % EX PTCH: 1.0000 | MEDICATED_PATCH | Freq: Two times a day (BID) | CUTANEOUS | 0 refills | Status: DC

## 2017-09-01 MED ORDER — NALOXONE HCL 2 MG/2ML IJ SOSY
0.4000 mg | PREFILLED_SYRINGE | Freq: Once | INTRAMUSCULAR | Status: AC
  Administered 2017-09-01: 0.4 mg via INTRAVENOUS
  Filled 2017-09-01: qty 2

## 2017-09-01 NOTE — ED Notes (Signed)
AAOx3.  Skin warm and dry.  NAD 

## 2017-09-01 NOTE — Discharge Instructions (Addendum)
Please follow up with your primary care physician.

## 2017-09-01 NOTE — ED Provider Notes (Signed)
 -----------------------------------------   8:29 AM on 09/01/2017 -----------------------------------------  Patient is awake, ambulatory, voiding. Stable, normal vital signs. Suitable for discharge at this time.  Final diagnoses:  Substance abuse [facility](HCC)      Sharman CheekStafford, Tetsuo Coppola, MD 09/01/17 [phone]

## 2017-09-01 NOTE — ED Notes (Signed)
In and out cath performed by Smitty KnudsenJenna RN, witnessed by Alamarcon Holding LLCKasey RN

## 2017-09-01 NOTE — ED Triage Notes (Signed)
MD at bedside. 

## 2017-09-01 NOTE — ED Notes (Signed)
Pt placed on bed alarm for pt safety. Non skid socks also placed on pt for safety.
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
## 2017-09-01 NOTE — ED Notes (Signed)
Patient up and ambulating in room. Ambulated independently to toilet and tolerated well.  Patient given discharge instructions given.  Patient refused to sign discharge.  Ambulated independently with steady gait.
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
## 2017-09-01 NOTE — ED Notes (Signed)
Pt ambulated to bathroom in room without difficulty.

## 2017-11-13 DIAGNOSIS — F1990 Other psychoactive substance use, unspecified, uncomplicated: Secondary | ICD-10-CM | POA: Insufficient documentation

## 2017-11-13 DIAGNOSIS — B182 Chronic viral hepatitis C: Secondary | ICD-10-CM | POA: Insufficient documentation

## 2017-11-17 ENCOUNTER — Ambulatory Visit: Payer: Self-pay | Admitting: Internal Medicine

## 2017-12-25 ENCOUNTER — Ambulatory Visit: Payer: Self-pay | Admitting: Internal Medicine

## 2018-01-27 ENCOUNTER — Ambulatory Visit: Payer: Self-pay | Admitting: Internal Medicine

## 2018-03-16 ENCOUNTER — Ambulatory Visit: Payer: Self-pay | Admitting: Nurse Practitioner

## 2018-03-27 ENCOUNTER — Encounter: Payer: Self-pay | Admitting: Nurse Practitioner

## 2018-03-27 ENCOUNTER — Ambulatory Visit: Payer: Self-pay | Attending: Nurse Practitioner | Admitting: Nurse Practitioner

## 2018-03-27 VITALS — BP 91/69 | HR 70 | Temp 98.9°F | Ht 64.0 in | Wt 142.8 lb

## 2018-03-27 DIAGNOSIS — R002 Palpitations: Secondary | ICD-10-CM | POA: Insufficient documentation

## 2018-03-27 DIAGNOSIS — F1994 Other psychoactive substance use, unspecified with psychoactive substance-induced mood disorder: Secondary | ICD-10-CM

## 2018-03-27 DIAGNOSIS — N760 Acute vaginitis: Secondary | ICD-10-CM

## 2018-03-27 DIAGNOSIS — Z8619 Personal history of other infectious and parasitic diseases: Secondary | ICD-10-CM | POA: Insufficient documentation

## 2018-03-27 DIAGNOSIS — F3160 Bipolar disorder, current episode mixed, unspecified: Secondary | ICD-10-CM

## 2018-03-27 DIAGNOSIS — Z800 Family history of malignant neoplasm of digestive organs: Secondary | ICD-10-CM | POA: Insufficient documentation

## 2018-03-27 DIAGNOSIS — Z833 Family history of diabetes mellitus: Secondary | ICD-10-CM | POA: Insufficient documentation

## 2018-03-27 NOTE — Patient Instructions (Signed)
Palpitations A palpitation is the feeling that your heart:  Has an uneven (irregular) heartbeat.  Is beating faster than normal.  Is fluttering.  Is skipping a beat.  This is usually not a serious problem. In some cases, you may need more medical tests. Follow these instructions at home:  Avoid: ? Caffeine in coffee, tea, soft drinks, diet pills, and energy drinks. ? Chocolate. ? Alcohol.  Do not use any tobacco products. These include cigarettes, chewing tobacco, and e-cigarettes. If you need help quitting, ask your doctor.  Try to reduce your stress. These things may help: ? Yoga. ? Meditation. ? Physical activity. Swimming, jogging, and walking are good choices. ? A method that helps you use your mind to control things in your body, like heartbeats (biofeedback).  Get plenty of rest and sleep.  Take over-the-counter and prescription medicines only as told by your doctor.  Keep all follow-up visits as told by your doctor. This is important. Contact a doctor if:  Your heartbeat is still fast or uneven after 24 hours.  Your palpitations occur more often. Get help right away if:  You have chest pain.  You feel short of breath.  You have a very bad headache.  You feel dizzy.  You pass out (faint). This information is not intended to replace advice given to you by your health care provider. Make sure you discuss any questions you have with your health care provider. Document Released: 06/18/2008 Document Revised: 02/15/2016 Document Reviewed: 05/25/2015 Elsevier Interactive Patient Education  2018 [employer]  

## 2018-03-27 NOTE — Progress Notes (Signed)
Assessment & Plan:  Rebecca Mcclure was seen today for new patient (initial visit).  Diagnoses and all orders for this visit:  Acute vaginitis -     Urine cytology ancillary only  Substance induced mood disorder (HCC) Currently stable. She has not used any illegal substances in 6 months  Bipolar affective disorder, current episode mixed, current episode severity unspecified (HCC) Patient states she was misdiagnosed while she was using drugs.     Patient has been counseled on age-appropriate routine health concerns for screening and prevention. These are reviewed and up-to-date. Referrals have been placed accordingly. Immunizations are up-to-date or declined.    Subjective:   Chief Complaint  Patient presents with  . New Patient (Initial Visit)    Pt. would like to establish here. Pt. stated she think she have a yeast infeciton.    HPI Rebecca Mcclure 32 y.o. female presents to office today to  establish care. She has a history of substance abuse and currently resides at the [facility] in [place], [place]. She has been "clean" for 6 months. She also has a history of Hep C. Has been treated with Harvoni twice and is currently seeing an ID specialist through [facility] system. Next Appt. June 03, 2018 with ID.   Vaginitis Patient complains of symptoms of vaginitis for over a  week. Vaginal symptoms include burning, local irritation and vulvar itching.Vulvar symptoms include local irritation. STI Risk: Very low risk of STD exposure. Discharge described as: normal and physiologic.Other associated symptoms: none.Menstrual pattern: She had been bleeding regularly.   Palpitations: Patient complains of palpitations.  The symptoms are of mild and transient in severity, occuring intermittently and lasting a few seconds per episode. Cardiac risk factors include: none. Aggravating factors: none. Relieving factors: spontaneous. Associated signs and symptoms: none    Review of Systems    Constitutional: Negative for fever, malaise/fatigue and weight loss.  HENT: Negative.  Negative for nosebleeds.   Eyes: Negative.  Negative for blurred vision, double vision and photophobia.  Respiratory: Negative.  Negative for cough and shortness of breath.   Cardiovascular: Positive for palpitations. Negative for chest pain and leg swelling.  Gastrointestinal: Negative.  Negative for heartburn, nausea and vomiting.  Genitourinary:       SEE HPI  Musculoskeletal: Negative.  Negative for myalgias.  Neurological: Negative.  Negative for dizziness, focal weakness, seizures and headaches.  Psychiatric/Behavioral: Positive for substance abuse. Negative for suicidal ideas.    Past Medical History:  Diagnosis Date  . Asthma    Pt. stated only when she plays sport.   . Drug addiction [facility])     Past Surgical History:  Procedure Laterality Date  . GANGLION CYST EXCISION    . NOSE SURGERY      Family History  Problem Relation Age of Onset  . Diabetes Mother   . Pancreatic cancer Mother   . Diabetes Father     Social History Reviewed with no changes to be made today.   No outpatient medications prior to visit.   No facility-administered medications prior to visit.     No Known Allergies     Objective:    BP 91/69 (BP Location: Left Arm, Patient Position: Sitting, Cuff Size: Normal)   Pulse 70   Temp 98.9 F (37.2 C) (Oral)   Ht 5\' 4"  (1.626 m)   Wt 142 lb 12.8 oz (64.8 kg)   SpO2 98%   BMI 24.51 kg/m  Wt Readings from Last 3 Encounters:  03/27/18 142  lb 12.8 oz (64.8 kg)  09/01/17 115 lb (52.2 kg)  05/12/17 109 lb (49.4 kg)    Physical Exam  Constitutional: She is oriented to person, place, and time. She appears well-developed and well-nourished. She is cooperative.  HENT:  Head: Normocephalic and atraumatic.  Eyes: EOM are normal.  Neck: Normal range of motion.  Cardiovascular: Normal rate, regular rhythm, normal heart sounds and intact distal pulses. Exam  reveals no gallop and no friction rub.  No murmur heard. Pulmonary/Chest: Effort normal and breath sounds normal. No tachypnea. No respiratory distress. She has no decreased breath sounds. She has no wheezes. She has no rhonchi. She has no rales. She exhibits no tenderness.  Abdominal: Soft. Bowel sounds are normal.  Musculoskeletal: Normal range of motion. She exhibits no edema.  Neurological: She is alert and oriented to person, place, and time. Coordination normal.  Skin: Skin is warm and dry.  Psychiatric: She has a normal mood and affect. Her behavior is normal. Judgment and thought content normal.  Nursing note and vitals reviewed.      Patient has been counseled extensively about nutrition and exercise as well as the importance of adherence with medications and regular follow-up. The patient was given clear instructions to go to ER or return to medical center if symptoms don't improve, worsen or new problems develop. The patient verbalized understanding.   Follow-up: Return for PAP SMEAR/EKG for palpitations .   Claiborne RiggZelda W Karmon Andis, FNP-BC [facility] [place], [place] [phone]   03/27/2018, 2:54 PM

## 2018-04-28 ENCOUNTER — Ambulatory Visit: Payer: Self-pay | Admitting: Nurse Practitioner

## 2018-07-16 ENCOUNTER — Ambulatory Visit: Payer: Self-pay | Attending: Family Medicine

## 2020-02-05 ENCOUNTER — Emergency Department (HOSPITAL_COMMUNITY)
Admission: EM | Admit: 2020-02-05 | Discharge: 2020-02-05 | Disposition: A | Discharge status: Home / Self Care | Payer: Self-pay | Attending: Emergency Medicine | Admitting: Emergency Medicine

## 2020-02-05 ENCOUNTER — Other Ambulatory Visit: Payer: Self-pay

## 2020-02-05 ENCOUNTER — Encounter (HOSPITAL_COMMUNITY): Payer: Self-pay

## 2020-02-05 ENCOUNTER — Emergency Department (HOSPITAL_COMMUNITY): Payer: Self-pay

## 2020-02-05 DIAGNOSIS — F1910 Other psychoactive substance abuse, uncomplicated: Secondary | ICD-10-CM | POA: Insufficient documentation

## 2020-02-05 DIAGNOSIS — Z87891 Personal history of nicotine dependence: Secondary | ICD-10-CM | POA: Insufficient documentation

## 2020-02-05 LAB — CBC WITH DIFFERENTIAL/PLATELET
Abs Immature Granulocytes: 0.06 10*3/uL (ref 0.00–0.07)
Basophils Absolute: 0.1 10*3/uL (ref 0.0–0.1)
Basophils Relative: 1 %
Eosinophils Absolute: 0.1 10*3/uL (ref 0.0–0.5)
Eosinophils Relative: 1 %
HCT: 35.3 % — ABNORMAL LOW (ref 36.0–46.0)
Hemoglobin: 11.9 g/dL — ABNORMAL LOW (ref 12.0–15.0)
Immature Granulocytes: 1 %
Lymphocytes Relative: 17 %
Lymphs Abs: 2.1 10*3/uL (ref 0.7–4.0)
MCH: 31.4 pg (ref 26.0–34.0)
MCHC: 33.7 g/dL (ref 30.0–36.0)
MCV: 93.1 fL (ref 80.0–100.0)
Monocytes Absolute: 0.9 10*3/uL (ref 0.1–1.0)
Monocytes Relative: 8 %
Neutro Abs: 8.9 10*3/uL — ABNORMAL HIGH (ref 1.7–7.7)
Neutrophils Relative %: 72 %
Platelets: 391 10*3/uL (ref 150–400)
RBC: 3.79 MIL/uL — ABNORMAL LOW (ref 3.87–5.11)
RDW: 13.7 % (ref 11.5–15.5)
WBC: 12 10*3/uL — ABNORMAL HIGH (ref 4.0–10.5)
nRBC: 0 % (ref 0.0–0.2)

## 2020-02-05 LAB — COMPREHENSIVE METABOLIC PANEL
ALT: 33 U/L (ref 0–44)
AST: 57 U/L — ABNORMAL HIGH (ref 15–41)
Albumin: 4.1 g/dL (ref 3.5–5.0)
Alkaline Phosphatase: 58 U/L (ref 38–126)
Anion gap: 9 (ref 5–15)
BUN: 17 mg/dL (ref 6–20)
CO2: 21 mmol/L — ABNORMAL LOW (ref 22–32)
Calcium: 8.7 mg/dL — ABNORMAL LOW (ref 8.9–10.3)
Chloride: 106 mmol/L (ref 98–111)
Creatinine, Ser: 0.83 mg/dL (ref 0.44–1.00)
GFR calc Af Amer: >60 mL/min (ref >60)
GFR calc non Af Amer: >60 mL/min (ref >60)
Glucose, Bld: 96 mg/dL (ref 70–99)
Potassium: 3.4 mmol/L — ABNORMAL LOW (ref 3.5–5.1)
Sodium: 136 mmol/L (ref 135–145)
Total Bilirubin: 1 mg/dL (ref 0.3–1.2)
Total Protein: 7.2 g/dL (ref 6.5–8.1)

## 2020-02-05 LAB — HCG, QUANTITATIVE, PREGNANCY: hCG, Beta Chain, Quant, S: <1 m[IU]/mL (ref <5)

## 2020-02-05 LAB — ETHANOL: Alcohol, Ethyl (B): <10 mg/dL (ref <10)

## 2020-02-05 MED ORDER — SODIUM CHLORIDE 0.9 % IV BOLUS: 1000.0000 mL | Freq: Once | INTRAVENOUS | Status: DC

## 2020-02-05 MED ORDER — DIPHENHYDRAMINE HCL 50 MG/ML IJ SOLN: 25.0000 mg | Freq: Once | INTRAMUSCULAR | Status: DC

## 2020-02-05 MED ORDER — LORAZEPAM 2 MG/ML IJ SOLN
1.0000 mg | Freq: Once | INTRAMUSCULAR | Status: AC
  Administered 2020-02-05: 1 mg via INTRAVENOUS
  Filled 2020-02-05: qty 1

## 2020-02-05 NOTE — ED Notes (Signed)
Pt unable to void; will continue to monitor.  

## 2020-02-05 NOTE — ED Provider Notes (Signed)
Care assumed from prior ED provider.  Patient is now alert and cooperative.  She appears to be competent at this time.  She is without any specific complaint.  She desires discharge.   Patient does understand the need for close follow-up.  Strict return precautions given and understood.  Of note, ankle x-ray is without evidence of acute fracture or injury.    Wynetta Fines, MD 02/05/20 1549
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
## 2020-02-05 NOTE — ED Notes (Signed)
IV removed from patient's right wrist.

## 2020-02-05 NOTE — ED Notes (Addendum)
Patient refuses benadryl , refuses to allow IV start. Patient states she is too cold to unfold her arms so nurse can attempt interventions.   MD notified

## 2020-02-05 NOTE — Discharge Instructions (Addendum)
Please return for any problem.  Follow-up with your regular care provider as instructed. °
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
## 2020-02-05 NOTE — ED Notes (Signed)
Patient got out of bed, ambulated to and from bathroom without difficulty. Patient requested another sprite.

## 2020-04-01 ENCOUNTER — Emergency Department: Admission: EM | Admit: 2020-04-01 | Discharge: 2020-04-01 | Discharge status: Against Medical Advice | Payer: Self-pay
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
## 2020-07-24 ENCOUNTER — Emergency Department: Payer: Self-pay

## 2020-07-24 ENCOUNTER — Emergency Department
Admission: EM | Admit: 2020-07-24 | Discharge: 2020-07-24 | Discharge status: Against Medical Advice | Payer: Self-pay | Attending: Student in an Organized Health Care Education/Training Program | Admitting: Student in an Organized Health Care Education/Training Program

## 2020-07-24 ENCOUNTER — Encounter: Payer: Self-pay | Admitting: Intensive Care

## 2020-07-24 ENCOUNTER — Other Ambulatory Visit: Payer: Self-pay

## 2020-07-24 DIAGNOSIS — N83201 Unspecified ovarian cyst, right side: Secondary | ICD-10-CM

## 2020-07-24 DIAGNOSIS — F17210 Nicotine dependence, cigarettes, uncomplicated: Secondary | ICD-10-CM | POA: Insufficient documentation

## 2020-07-24 DIAGNOSIS — N10 Acute tubulo-interstitial nephritis: Secondary | ICD-10-CM | POA: Insufficient documentation

## 2020-07-24 DIAGNOSIS — R102 Pelvic and perineal pain: Secondary | ICD-10-CM

## 2020-07-24 DIAGNOSIS — N12 Tubulo-interstitial nephritis, not specified as acute or chronic: Secondary | ICD-10-CM

## 2020-07-24 DIAGNOSIS — J45909 Unspecified asthma, uncomplicated: Secondary | ICD-10-CM | POA: Insufficient documentation

## 2020-07-24 LAB — CBC
HCT: 46.9 % — ABNORMAL HIGH (ref 36.0–46.0)
Hemoglobin: 15.4 g/dL — ABNORMAL HIGH (ref 12.0–15.0)
MCH: 31.6 pg (ref 26.0–34.0)
MCHC: 32.8 g/dL (ref 30.0–36.0)
MCV: 96.3 fL (ref 80.0–100.0)
Platelets: 419 10*3/uL — ABNORMAL HIGH (ref 150–400)
RBC: 4.87 MIL/uL (ref 3.87–5.11)
RDW: 12.9 % (ref 11.5–15.5)
WBC: 31.3 10*3/uL — ABNORMAL HIGH (ref 4.0–10.5)
nRBC: 0 % (ref 0.0–0.2)

## 2020-07-24 LAB — CHLAMYDIA/NGC RT PCR (ARMC ONLY)
Chlamydia Tr: NOT DETECTED
N gonorrhoeae: NOT DETECTED

## 2020-07-24 LAB — WET PREP, GENITAL
Clue Cells Wet Prep HPF POC: NONE SEEN
Sperm: NONE SEEN
Trich, Wet Prep: NONE SEEN
WBC, Wet Prep HPF POC: NONE SEEN
Yeast Wet Prep HPF POC: NONE SEEN

## 2020-07-24 LAB — URINALYSIS, COMPLETE (UACMP) WITH MICROSCOPIC
Glucose, UA: NEGATIVE mg/dL
Nitrite: POSITIVE — AB
Specific Gravity, Urine: >1.03 — ABNORMAL HIGH (ref 1.005–1.030)
pH: 5 (ref 5.0–8.0)

## 2020-07-24 LAB — COMPREHENSIVE METABOLIC PANEL
ALT: 11 U/L (ref 0–44)
AST: 18 U/L (ref 15–41)
Albumin: 4.6 g/dL (ref 3.5–5.0)
Alkaline Phosphatase: 67 U/L (ref 38–126)
Anion gap: 10 (ref 5–15)
BUN: 22 mg/dL — ABNORMAL HIGH (ref 6–20)
CO2: 22 mmol/L (ref 22–32)
Calcium: 9.3 mg/dL (ref 8.9–10.3)
Chloride: 107 mmol/L (ref 98–111)
Creatinine, Ser: 0.94 mg/dL (ref 0.44–1.00)
GFR, Estimated: >60 mL/min (ref >60)
Glucose, Bld: 134 mg/dL — ABNORMAL HIGH (ref 70–99)
Potassium: 4.6 mmol/L (ref 3.5–5.1)
Sodium: 139 mmol/L (ref 135–145)
Total Bilirubin: 0.5 mg/dL (ref 0.3–1.2)
Total Protein: 8.4 g/dL — ABNORMAL HIGH (ref 6.5–8.1)

## 2020-07-24 LAB — POC URINE PREG, ED: Preg Test, Ur: NEGATIVE

## 2020-07-24 LAB — LIPASE, BLOOD: Lipase: 35 U/L (ref 11–51)

## 2020-07-24 LAB — LACTIC ACID, PLASMA: Lactic Acid, Venous: 1.4 mmol/L (ref 0.5–1.9)

## 2020-07-24 MED ORDER — IOHEXOL 300 MG/ML  SOLN
100.0000 mL | Freq: Once | INTRAMUSCULAR | Status: AC | PRN
  Administered 2020-07-24: 100 mL via INTRAVENOUS

## 2020-07-24 MED ORDER — SODIUM CHLORIDE 0.9 % IV SOLN
1.0000 g | Freq: Once | INTRAVENOUS | Status: AC
  Administered 2020-07-24: 1 g via INTRAVENOUS
  Filled 2020-07-24: qty 10

## 2020-07-24 MED ORDER — DOXYCYCLINE HYCLATE 100 MG PO TABS
100.0000 mg | ORAL_TABLET | Freq: Once | ORAL | Status: AC
  Administered 2020-07-24: 100 mg via ORAL
  Filled 2020-07-24: qty 1

## 2020-07-24 MED ORDER — ONDANSETRON HCL 4 MG PO TABS: 4.0000 mg | ORAL_TABLET | Freq: Every day | ORAL | 0 refills | Status: DC | PRN

## 2020-07-24 MED ORDER — SODIUM CHLORIDE 0.9 % IV BOLUS
1000.0000 mL | Freq: Once | INTRAVENOUS | Status: AC
  Administered 2020-07-24: 1000 mL via INTRAVENOUS

## 2020-07-24 MED ORDER — DOXYCYCLINE HYCLATE 100 MG PO TABS: 100.0000 mg | ORAL_TABLET | Freq: Two times a day (BID) | ORAL | 0 refills | Status: AC

## 2020-07-24 MED ORDER — OXYCODONE-ACETAMINOPHEN 5-325 MG PO TABS
1.0000 | ORAL_TABLET | ORAL | Status: DC | PRN
  Administered 2020-07-24: 1 via ORAL
  Filled 2020-07-24: qty 1

## 2020-07-24 MED ORDER — CEPHALEXIN 500 MG PO CAPS: 500.0000 mg | ORAL_CAPSULE | Freq: Three times a day (TID) | ORAL | 0 refills | Status: AC

## 2020-07-24 MED ORDER — ONDANSETRON 4 MG PO TBDP
4.0000 mg | ORAL_TABLET | Freq: Once | ORAL | Status: AC
  Administered 2020-07-24: 4 mg via ORAL
  Filled 2020-07-24: qty 1

## 2020-07-24 MED ORDER — SODIUM CHLORIDE 0.9 % IV SOLN: Freq: Once | INTRAVENOUS | Status: AC

## 2020-07-24 NOTE — ED Provider Notes (Signed)
[facility] Emergency Department Provider Note    First MD Initiated Contact with Patient 07/24/20 1111     (approximate)  I have reviewed the triage vital signs and the nursing notes.   HISTORY  Chief Complaint Abdominal Pain, Emesis, and Nausea    HPI Rebecca Mcclure is a 34 y.o. female with below listed past medical history presents to the ER for nausea vomiting abdominal pain vaginal discharge dysuria.  She is worried that she has an STI.  Has history of substance abuse and did use meth over the weekend.  Denies any chest pain or cough.  No measured fevers.  No recent antibiotics.   Past Medical History:  Diagnosis Date  . Asthma    Pt. stated only when she plays sport.   . Drug addiction [facility])    Family History  Problem Relation Age of Onset  . Diabetes Mother   . Pancreatic cancer Mother   . Diabetes Father    Past Surgical History:  Procedure Laterality Date  . GANGLION CYST EXCISION    . NOSE SURGERY     Patient Active Problem List   Diagnosis Date Noted  . Bipolar affective disorder, current episode mixed (HCC) 03/27/2018  . Biliary colic 04/28/2017  . Cocaine abuse (HCC) 11/05/2016  . Substance induced mood disorder (HCC) 11/05/2016  . Substance induced mood disorder (HCC) 02/06/2016  . Cocaine abuse (HCC) 02/06/2016  . Marijuana abuse 02/06/2016  . Noncompliance 02/06/2016  . 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 Authorizing Provider  cephALEXin (KEFLEX) 500 MG capsule Take 1 capsule (500 mg total) by mouth 3 (three) times daily for 7 days. 07/24/20 07/31/20  Willy Eddy, MD  doxycycline (VIBRA-TABS) 100 MG tablet Take 1 tablet (100 mg total) by mouth 2 (two) times daily for 7 days. 07/24/20 07/31/20  Willy Eddy, MD  ondansetron (ZOFRAN) 4 MG tablet Take 1 tablet (4 mg total) by mouth daily as needed for nausea or vomiting.  07/24/20 07/24/21  Willy Eddy, MD    Allergies Patient has no known allergies.    Social History Social History   Tobacco Use  . Smoking status: Current Every Day Smoker    Packs/day: 1.00    Types: Cigarettes  . Smokeless tobacco: Never Used  Vaping Use  . Vaping Use: Every day  Substance Use Topics  . Alcohol use: No  . Drug use: Yes    Types: Marijuana, Cocaine, Methamphetamines    Comment: meth    Review of Systems Patient denies headaches, rhinorrhea, blurry vision, numbness, shortness of breath, chest pain, edema, cough, abdominal pain, nausea, vomiting, diarrhea, dysuria, fevers, rashes or hallucinations unless otherwise stated above in HPI. ____________________________________________   PHYSICAL EXAM:  VITAL SIGNS: Vitals:   07/24/20 1224 07/24/20 1225  BP:    Pulse: 74 77  Resp:    Temp:    SpO2: 99% 100%    Constitutional: Alert and oriented.  Eyes: Conjunctivae are normal.  Head: Atraumatic. Nose: No congestion/rhinnorhea. Mouth/Throat: Mucous membranes are moist.   Neck: No stridor. Painless ROM.  Cardiovascular: Normal rate, regular rhythm. Grossly normal heart sounds.  Good peripheral circulation. Respiratory: Normal respiratory effort.  No retractions. Lungs CTAB. Gastrointestinal: Soft and nontender. No distention. No abdominal bruits. No CVA tenderness. Genitourinary: No cervical motion tenderness in that there is right adnexal tenderness.  No significant purulent drainage from the cervical os Musculoskeletal:  No lower extremity tenderness nor edema.  No joint effusions. Neurologic:  Normal speech and language. No gross focal neurologic deficits are appreciated. No facial droop Skin:  Skin is warm, dry and intact. No rash noted. Psychiatric: Mood and affect are normal. Speech and behavior are normal.  ____________________________________________   LABS (all labs ordered are listed, but only abnormal results are displayed)  Results  for orders placed or performed during the hospital encounter of 07/24/20 (from the past 24 hour(s))  Lipase, blood     Status: None   Collection Time: 07/24/20  8:49 AM  Result Value Ref Range   Lipase 35 11 - 51 U/L  Comprehensive metabolic panel     Status: Abnormal   Collection Time: 07/24/20  8:49 AM  Result Value Ref Range   Sodium 139 135 - 145 mmol/L   Potassium 4.6 3.5 - 5.1 mmol/L   Chloride 107 98 - 111 mmol/L   CO2 22 22 - 32 mmol/L   Glucose, Bld 134 (H) 70 - 99 mg/dL   BUN 22 (H) 6 - 20 mg/dL   Creatinine, Ser 1.300.94 0.44 - 1.00 mg/dL   Calcium 9.3 8.9 - 86.510.3 mg/dL   Total Protein 8.4 (H) 6.5 - 8.1 g/dL   Albumin 4.6 3.5 - 5.0 g/dL   AST 18 15 - 41 U/L   ALT 11 0 - 44 U/L   Alkaline Phosphatase 67 38 - 126 U/L   Total Bilirubin 0.5 0.3 - 1.2 mg/dL   GFR, Estimated >78>60 >46>60 mL/min   Anion gap 10 5 - 15  CBC     Status: Abnormal   Collection Time: 07/24/20  8:49 AM  Result Value Ref Range   WBC 31.3 (H) 4.0 - 10.5 K/uL   RBC 4.87 3.87 - 5.11 MIL/uL   Hemoglobin 15.4 (H) 12.0 - 15.0 g/dL   HCT 96.246.9 (H) 36 - 46 %   MCV 96.3 80.0 - 100.0 fL   MCH 31.6 26.0 - 34.0 pg   MCHC 32.8 30.0 - 36.0 g/dL   RDW 95.212.9 84.111.5 - 32.415.5 %   Platelets 419 (H) 150 - 400 K/uL   nRBC 0.0 0.0 - 0.2 %  Urinalysis, Complete w Microscopic     Status: Abnormal   Collection Time: 07/24/20 12:13 PM  Result Value Ref Range   Color, Urine YELLOW YELLOW   APPearance CLOUDY (A) CLEAR   Specific Gravity, Urine >1.030 (H) 1.005 - 1.030   pH 5.0 5.0 - 8.0   Glucose, UA NEGATIVE NEGATIVE mg/dL   Hgb urine dipstick TRACE (A) NEGATIVE   Bilirubin Urine SMALL (A) NEGATIVE   Ketones, ur TRACE (A) NEGATIVE mg/dL   Protein, ur TRACE (A) NEGATIVE mg/dL   Nitrite POSITIVE (A) NEGATIVE   Leukocytes,Ua TRACE (A) NEGATIVE   Squamous Epithelial / LPF 11-20 0 - 5   WBC, UA 21-50 0 - 5 WBC/hpf   RBC / HPF 0-5 0 - 5 RBC/hpf   Bacteria, UA MANY (A) NONE SEEN   Mucus PRESENT   Wet prep, genital     Status: None    Collection Time: 07/24/20 12:13 PM   Specimen: Vaginal  Result Value Ref Range   Yeast Wet Prep HPF POC NONE SEEN NONE SEEN   Trich, Wet Prep NONE SEEN NONE SEEN   Clue Cells Wet Prep HPF POC NONE SEEN NONE SEEN   WBC, Wet Prep HPF POC NONE SEEN NONE SEEN   Sperm NONE SEEN   Chlamydia/NGC rt PCR (  [facility] only)     Status: None   Collection Time: 07/24/20 12:13 PM   Specimen: Vaginal  Result Value Ref Range   Specimen source GC/Chlam ENDOCERVICAL    Chlamydia Tr NOT DETECTED NOT DETECTED   N gonorrhoeae NOT DETECTED NOT DETECTED  Lactic acid, plasma     Status: None   Collection Time: 07/24/20 12:13 PM  Result Value Ref Range   Lactic Acid, Venous 1.4 0.5 - 1.9 mmol/L  POC urine preg, ED     Status: None   Collection Time: 07/24/20 12:17 PM  Result Value Ref Range   Preg Test, Ur NEGATIVE NEGATIVE   ____________________________________________ ____________________________________________  RADIOLOGY  I personally reviewed all radiographic images ordered to evaluate for the above acute complaints and reviewed radiology reports and findings.  These findings were personally discussed with the patient.  Please see medical record for radiology report.  ____________________________________________   PROCEDURES  Procedure(s) performed:  Procedures    Critical Care performed: no ____________________________________________   INITIAL IMPRESSION / ASSESSMENT AND PLAN / ED COURSE  Pertinent labs & imaging results that were available during my care of the patient were reviewed by me and considered in my medical decision making (see chart for details).   DDX: Abscess, sepsis, Pilo, STD, PID, TOA, appendicitis, colitis, cystitis  Rebecca Mcclure is a 34 y.o. who presents to the ED with presentation as described above.  Patient is ill-appearing primary complaint seems to be abdominal pain that is generalized associated nausea vomiting.  Will give IV fluids.  Noted to have  significant leukocytosis to 31,000.  No significant metabolic derangement.  Will give IV fluids and antiemetic.  Imaging will be ordered for above complaints.  Clinical Course as of Jul 25 1551  Mon Jul 24, 2020  1257 Pelvic exam performed does have some trace purulence no significant cervical motion tenderness but does have right adnexal tenderness to palpation.   [PR]  1400 CT imaging with normal appendix but does have right adnexal cystic abnormality.  Given location of pain will order ultrasound to further characterize.   [PR]  1528 Patient reassessed.  She feels improved.  Repeat abdominal exam is not peritoneal.  Wet prep negative.  Have lower suspicion for PID.  Still awaiting ultrasound.  Based on her white count and concerning presentation for pyelonephritis I did recommend hospitalization regardless of the ultrasound for IV antibiotics and IV fluids but patient is refusing this stating that she will leave AGAINST MEDICAL ADVICE but willing to stay for follow-up on results of ultrasound.  Patient demonstrates understanding the my recommendation is for admission for IV fluids to prevent dehydration and ensure that the antibiotics are appropriately covering her pyelonephritis.  She understands that worsening of this type of presentation could result in organ damage that is permanent, causes permanent disability and even death.     [PR]  1543 Discussed in consultation with Dr. Tiburcio Pea of OB/GYN regarding my concern for the patient's presentation with US showing hemorrhagic cyst. She not having any fevers.  She otherwise feels well and improved after fluids  Explained to the patient that TOA is also on the differential but patient still stating that she will not be staying in the hospital and will continue plan to leave AGAINST MEDICAL ADVICE.  Dr. Tiburcio Pea has recommended adding on doxycycline to empirically cover.  She is not having any other symptoms of PID  I have instructed patient to follow-up  closely with OB/GYN for reassessment given this cyst.  We discussed strict return precautions and signs and symptoms for which she should return to the ER sooner if she changes her mind about hospitalization.   [PR]    Clinical Course User Index [PR] Willy Eddy, MD    The patient was evaluated in Emergency Department today for the symptoms described in the history of present illness. He/she was evaluated in the context of the global COVID-19 pandemic, which necessitated consideration that the patient might be at risk for infection with the SARS-CoV-2 virus that causes COVID-19. Institutional protocols and algorithms that pertain to the evaluation of patients at risk for COVID-19 are in a state of rapid change based on information released by regulatory bodies including the CDC and federal and state organizations. These policies and algorithms were followed during the patient's care in the ED.  As part of my medical decision making, I reviewed the following data within the electronic MEDICAL RECORD NUMBER Nursing notes reviewed and incorporated, Labs reviewed, notes from prior ED visits and [place] Controlled Substance Database   ____________________________________________   FINAL CLINICAL IMPRESSION(S) / ED DIAGNOSES  Final diagnoses:  Right adnexal tenderness  Cyst of right ovary  Pyelonephritis      NEW MEDICATIONS STARTED DURING THIS VISIT:  New Prescriptions   CEPHALEXIN (KEFLEX) 500 MG CAPSULE    Take 1 capsule (500 mg total) by mouth 3 (three) times daily for 7 days.   DOXYCYCLINE (VIBRA-TABS) 100 MG TABLET    Take 1 tablet (100 mg total) by mouth 2 (two) times daily for 7 days.   ONDANSETRON (ZOFRAN) 4 MG TABLET    Take 1 tablet (4 mg total) by mouth daily as needed for nausea or vomiting.     Note:  This document was prepared using Dragon voice recognition software and may include unintentional dictation errors.    Willy Eddy, MD 07/24/20 [id number]

## 2020-07-24 NOTE — ED Triage Notes (Signed)
Arrived by EMS from home. C/o abdominal pain with N/V/D that started yesterday. C/o vaginal discharge with foul odor for a few days

## 2020-07-24 NOTE — Discharge Instructions (Signed)

## 2020-07-24 NOTE — ED Notes (Signed)
Only able to obtain 1 set of blood cultures at this time. MD made aware. MD given ok to start antibiotic as to not delay pt care

## 2020-07-24 NOTE — ED Notes (Signed)
Phlebotomist called to get blood

## 2020-07-24 NOTE — ED Notes (Signed)
FIRST NURSE: Pt to ER via EMS from home with c/o n/v since yesterday.  Per EMS VSS, pt ambulatory in lobby, noted dry heaves in lobby.

## 2020-07-24 NOTE — ED Notes (Signed)
This RN unable to obtain IV access at this time  

## 2020-07-26 ENCOUNTER — Ambulatory Visit: Payer: Self-pay | Admitting: Obstetrics and Gynecology

## 2020-07-29 LAB — CULTURE, BLOOD (ROUTINE X 2)
Culture: NO GROWTH
Special Requests: ADEQUATE

## 2020-09-07 ENCOUNTER — Other Ambulatory Visit: Payer: Self-pay

## 2020-09-07 ENCOUNTER — Emergency Department
Admission: EM | Admit: 2020-09-07 | Discharge: 2020-09-07 | Disposition: A | Discharge status: Home / Self Care | Payer: Self-pay | Attending: Emergency Medicine | Admitting: Emergency Medicine

## 2020-09-07 DIAGNOSIS — R1030 Lower abdominal pain, unspecified: Secondary | ICD-10-CM | POA: Insufficient documentation

## 2020-09-07 DIAGNOSIS — M545 Low back pain, unspecified: Secondary | ICD-10-CM | POA: Insufficient documentation

## 2020-09-07 DIAGNOSIS — Z5321 Procedure and treatment not carried out due to patient leaving prior to being seen by health care provider: Secondary | ICD-10-CM | POA: Insufficient documentation

## 2020-09-07 LAB — URINALYSIS, COMPLETE (UACMP) WITH MICROSCOPIC
Bilirubin Urine: NEGATIVE
Glucose, UA: NEGATIVE mg/dL
Hgb urine dipstick: NEGATIVE
Ketones, ur: NEGATIVE mg/dL
Nitrite: NEGATIVE
Protein, ur: NEGATIVE mg/dL
Specific Gravity, Urine: 1.018 (ref 1.005–1.030)
pH: 7 (ref 5.0–8.0)

## 2020-09-07 LAB — BASIC METABOLIC PANEL
Anion gap: 9 (ref 5–15)
BUN: 18 mg/dL (ref 6–20)
CO2: 28 mmol/L (ref 22–32)
Calcium: 9.1 mg/dL (ref 8.9–10.3)
Chloride: 100 mmol/L (ref 98–111)
Creatinine, Ser: 0.83 mg/dL (ref 0.44–1.00)
GFR, Estimated: >60 mL/min (ref >60)
Glucose, Bld: 69 mg/dL — ABNORMAL LOW (ref 70–99)
Potassium: 3.6 mmol/L (ref 3.5–5.1)
Sodium: 137 mmol/L (ref 135–145)

## 2020-09-07 LAB — CBC
HCT: 41 % (ref 36.0–46.0)
Hemoglobin: 13.5 g/dL (ref 12.0–15.0)
MCH: 31.6 pg (ref 26.0–34.0)
MCHC: 32.9 g/dL (ref 30.0–36.0)
MCV: 96 fL (ref 80.0–100.0)
Platelets: 425 10*3/uL — ABNORMAL HIGH (ref 150–400)
RBC: 4.27 MIL/uL (ref 3.87–5.11)
RDW: 13.2 % (ref 11.5–15.5)
WBC: 10.7 10*3/uL — ABNORMAL HIGH (ref 4.0–10.5)
nRBC: 0 % (ref 0.0–0.2)

## 2020-09-07 LAB — POC URINE PREG, ED: Preg Test, Ur: NEGATIVE

## 2020-09-07 NOTE — ED Notes (Signed)
No answer when called several times from lobby 

## 2020-09-07 NOTE — ED Triage Notes (Signed)
PT to ED from home c/o lower abd and lower back pain. States was recently dx with infection in her R fallopian tube and left against medical advice. Also states they told her that her kidneys were messing up and she thinks they are hurting her.

## 2020-09-07 NOTE — ED Notes (Signed)
No answer when called several times from lobby & cell phone # listed in chart 

## 2020-09-24 ENCOUNTER — Encounter: Payer: Self-pay | Admitting: Emergency Medicine

## 2020-09-24 ENCOUNTER — Other Ambulatory Visit: Payer: Self-pay

## 2020-09-24 ENCOUNTER — Emergency Department
Admission: EM | Admit: 2020-09-24 | Discharge: 2020-09-24 | Discharge status: Against Medical Advice | Payer: Self-pay | Attending: Emergency Medicine | Admitting: Emergency Medicine

## 2020-09-24 DIAGNOSIS — Z5321 Procedure and treatment not carried out due to patient leaving prior to being seen by health care provider: Secondary | ICD-10-CM | POA: Insufficient documentation

## 2020-09-24 DIAGNOSIS — R109 Unspecified abdominal pain: Secondary | ICD-10-CM | POA: Insufficient documentation

## 2020-09-24 LAB — URINALYSIS, COMPLETE (UACMP) WITH MICROSCOPIC
Bilirubin Urine: NEGATIVE
Glucose, UA: NEGATIVE mg/dL
Hgb urine dipstick: NEGATIVE
Ketones, ur: NEGATIVE mg/dL
Leukocytes,Ua: NEGATIVE
Nitrite: POSITIVE — AB
Protein, ur: NEGATIVE mg/dL
Specific Gravity, Urine: 1.021 (ref 1.005–1.030)
pH: 7 (ref 5.0–8.0)

## 2020-09-24 LAB — CBC
HCT: 38.7 % (ref 36.0–46.0)
Hemoglobin: 12.8 g/dL (ref 12.0–15.0)
MCH: 31.7 pg (ref 26.0–34.0)
MCHC: 33.1 g/dL (ref 30.0–36.0)
MCV: 95.8 fL (ref 80.0–100.0)
Platelets: 402 10*3/uL — ABNORMAL HIGH (ref 150–400)
RBC: 4.04 MIL/uL (ref 3.87–5.11)
RDW: 13.3 % (ref 11.5–15.5)
WBC: 6.8 10*3/uL (ref 4.0–10.5)
nRBC: 0 % (ref 0.0–0.2)

## 2020-09-24 LAB — LIPASE, BLOOD: Lipase: 101 U/L — ABNORMAL HIGH (ref 11–51)

## 2020-09-24 LAB — COMPREHENSIVE METABOLIC PANEL
ALT: 16 U/L (ref 0–44)
AST: 19 U/L (ref 15–41)
Albumin: 3.6 g/dL (ref 3.5–5.0)
Alkaline Phosphatase: 57 U/L (ref 38–126)
Anion gap: 9 (ref 5–15)
BUN: 16 mg/dL (ref 6–20)
CO2: 27 mmol/L (ref 22–32)
Calcium: 8.7 mg/dL — ABNORMAL LOW (ref 8.9–10.3)
Chloride: 104 mmol/L (ref 98–111)
Creatinine, Ser: 0.85 mg/dL (ref 0.44–1.00)
GFR, Estimated: >60 mL/min (ref >60)
Glucose, Bld: 74 mg/dL (ref 70–99)
Potassium: 3.9 mmol/L (ref 3.5–5.1)
Sodium: 140 mmol/L (ref 135–145)
Total Bilirubin: 0.4 mg/dL (ref 0.3–1.2)
Total Protein: 7.1 g/dL (ref 6.5–8.1)

## 2020-09-24 LAB — POC URINE PREG, ED: Preg Test, Ur: NEGATIVE

## 2020-09-24 NOTE — ED Triage Notes (Signed)
FIRST NURSE NOTE: Pt here via [facility] with COVID sxs and wants to be evaluated for R fallopian tube infection.  She has presented to ED multiple times in the past month for the same fallopian tube complaint, but leaves AMA.

## 2020-09-24 NOTE — ED Notes (Signed)
Called for VS reassessment; pt not found in lobby or outside.

## 2020-09-24 NOTE — ED Triage Notes (Signed)
Pt called for VS/reassessment, no response.

## 2020-09-24 NOTE — ED Notes (Signed)
Pt unable to void at this time, given cup to try

## 2020-09-24 NOTE — ED Triage Notes (Signed)
Pt arrived via [facility] with reports of R fallopian tube pain for several weeks and covid sxs.  No distress noted.

## 2020-12-21 ENCOUNTER — Emergency Department: Payer: Self-pay

## 2020-12-21 ENCOUNTER — Other Ambulatory Visit: Payer: Self-pay | Admitting: Emergency Medicine

## 2020-12-21 ENCOUNTER — Emergency Department
Admission: EM | Admit: 2020-12-21 | Discharge: 2020-12-21 | Disposition: A | Discharge status: Home / Self Care | Payer: Self-pay | Attending: Emergency Medicine | Admitting: Emergency Medicine

## 2020-12-21 ENCOUNTER — Encounter: Payer: Self-pay | Admitting: Emergency Medicine

## 2020-12-21 ENCOUNTER — Other Ambulatory Visit: Payer: Self-pay

## 2020-12-21 DIAGNOSIS — N7011 Chronic salpingitis: Secondary | ICD-10-CM

## 2020-12-21 DIAGNOSIS — R1031 Right lower quadrant pain: Secondary | ICD-10-CM | POA: Insufficient documentation

## 2020-12-21 DIAGNOSIS — J45909 Unspecified asthma, uncomplicated: Secondary | ICD-10-CM | POA: Insufficient documentation

## 2020-12-21 DIAGNOSIS — R112 Nausea with vomiting, unspecified: Secondary | ICD-10-CM

## 2020-12-21 DIAGNOSIS — E876 Hypokalemia: Secondary | ICD-10-CM | POA: Insufficient documentation

## 2020-12-21 DIAGNOSIS — F17210 Nicotine dependence, cigarettes, uncomplicated: Secondary | ICD-10-CM | POA: Insufficient documentation

## 2020-12-21 LAB — CBC
HCT: 46.2 % — ABNORMAL HIGH (ref 36.0–46.0)
Hemoglobin: 15.8 g/dL — ABNORMAL HIGH (ref 12.0–15.0)
MCH: 31.1 pg (ref 26.0–34.0)
MCHC: 34.2 g/dL (ref 30.0–36.0)
MCV: 90.9 fL (ref 80.0–100.0)
Platelets: 354 10*3/uL (ref 150–400)
RBC: 5.08 MIL/uL (ref 3.87–5.11)
RDW: 12.8 % (ref 11.5–15.5)
WBC: 5.6 10*3/uL (ref 4.0–10.5)
nRBC: 0 % (ref 0.0–0.2)

## 2020-12-21 LAB — MAGNESIUM: Magnesium: 1.8 mg/dL (ref 1.7–2.4)

## 2020-12-21 LAB — COMPREHENSIVE METABOLIC PANEL
ALT: 14 U/L (ref 0–44)
AST: 33 U/L (ref 15–41)
Albumin: 3.6 g/dL (ref 3.5–5.0)
Alkaline Phosphatase: 53 U/L (ref 38–126)
Anion gap: 11 (ref 5–15)
BUN: 16 mg/dL (ref 6–20)
CO2: 22 mmol/L (ref 22–32)
Calcium: 8.3 mg/dL — ABNORMAL LOW (ref 8.9–10.3)
Chloride: 99 mmol/L (ref 98–111)
Creatinine, Ser: 0.82 mg/dL (ref 0.44–1.00)
GFR, Estimated: >60 mL/min (ref >60)
Glucose, Bld: 111 mg/dL — ABNORMAL HIGH (ref 70–99)
Potassium: 2.8 mmol/L — ABNORMAL LOW (ref 3.5–5.1)
Sodium: 132 mmol/L — ABNORMAL LOW (ref 135–145)
Total Bilirubin: 0.5 mg/dL (ref 0.3–1.2)
Total Protein: 7 g/dL (ref 6.5–8.1)

## 2020-12-21 LAB — URINALYSIS, COMPLETE (UACMP) WITH MICROSCOPIC
Bilirubin Urine: NEGATIVE
Glucose, UA: NEGATIVE mg/dL
Ketones, ur: 5 mg/dL — AB
Leukocytes,Ua: NEGATIVE
Nitrite: NEGATIVE
Protein, ur: NEGATIVE mg/dL
Specific Gravity, Urine: 1.015 (ref 1.005–1.030)
pH: 6 (ref 5.0–8.0)

## 2020-12-21 LAB — LIPASE, BLOOD: Lipase: 29 U/L (ref 11–51)

## 2020-12-21 LAB — HCG, QUANTITATIVE, PREGNANCY: hCG, Beta Chain, Quant, S: <1 m[IU]/mL (ref <5)

## 2020-12-21 MED ORDER — MORPHINE SULFATE (PF) 2 MG/ML IV SOLN
2.0000 mg | Freq: Once | INTRAVENOUS | Status: AC
  Administered 2020-12-21: 2 mg via INTRAVENOUS
  Filled 2020-12-21: qty 1

## 2020-12-21 MED ORDER — SODIUM CHLORIDE 0.9 % IV BOLUS
1000.0000 mL | Freq: Once | INTRAVENOUS | Status: AC
  Administered 2020-12-21: 1000 mL via INTRAVENOUS

## 2020-12-21 MED ORDER — DICYCLOMINE HCL 10 MG PO CAPS: 10.0000 mg | ORAL_CAPSULE | Freq: Four times a day (QID) | ORAL | 0 refills | Status: DC

## 2020-12-21 MED ORDER — POTASSIUM CHLORIDE CRYS ER 20 MEQ PO TBCR
40.0000 meq | EXTENDED_RELEASE_TABLET | Freq: Once | ORAL | Status: AC
  Administered 2020-12-21: 40 meq via ORAL
  Filled 2020-12-21: qty 2

## 2020-12-21 MED ORDER — DOXYCYCLINE HYCLATE 100 MG PO CAPS: 100.0000 mg | ORAL_CAPSULE | Freq: Two times a day (BID) | ORAL | 0 refills | Status: DC

## 2020-12-21 MED ORDER — DICYCLOMINE HCL 10 MG PO CAPS
10.0000 mg | ORAL_CAPSULE | Freq: Once | ORAL | Status: AC
  Administered 2020-12-21: 10 mg via ORAL
  Filled 2020-12-21: qty 1

## 2020-12-21 MED ORDER — IOHEXOL 300 MG/ML  SOLN
75.0000 mL | Freq: Once | INTRAMUSCULAR | Status: AC | PRN
  Administered 2020-12-21: 75 mL via INTRAVENOUS

## 2020-12-21 MED ORDER — LACTATED RINGERS IV BOLUS
1000.0000 mL | Freq: Once | INTRAVENOUS | Status: AC
  Administered 2020-12-21: 1000 mL via INTRAVENOUS

## 2020-12-21 MED ORDER — ONDANSETRON HCL 4 MG PO TABS: 4.0000 mg | ORAL_TABLET | Freq: Three times a day (TID) | ORAL | 0 refills | Status: DC | PRN

## 2020-12-21 MED ORDER — ONDANSETRON HCL 4 MG/2ML IJ SOLN
4.0000 mg | Freq: Once | INTRAMUSCULAR | Status: AC
  Administered 2020-12-21: 4 mg via INTRAVENOUS
  Filled 2020-12-21: qty 2

## 2020-12-21 MED ORDER — POTASSIUM CHLORIDE 10 MEQ/100ML IV SOLN
10.0000 meq | INTRAVENOUS | Status: DC
  Administered 2020-12-21 (×2): 10 meq via INTRAVENOUS
  Filled 2020-12-21 (×2): qty 100

## 2020-12-21 MED ORDER — METRONIDAZOLE 500 MG PO TABS: 500.0000 mg | ORAL_TABLET | Freq: Two times a day (BID) | ORAL | 0 refills | Status: DC

## 2020-12-21 NOTE — ED Provider Notes (Signed)
[facility] Emergency Department Provider Note ____________________________________________   Event Date/Time   First MD Initiated Contact with Patient 12/21/20 1727     (approximate)  I have reviewed the triage vital signs and the nursing notes.   HISTORY  Chief Complaint Abdominal Pain    HPI Rebecca Mcclure is a 35 y.o. female with PMH as noted below who presents with nausea and vomiting over the last 5 days, associated with bilateral lower abdominal pain and watery nonbloody diarrhea.  The patient states that she has had similar symptoms previously.  On a prior visit to the ED, she was told that she had a problem with her fallopian tube and would need surgery.  She signed out AMA at that time.  The patient is on her period but denies any abnormal vaginal bleeding or discharge.  She has had some difficulty urinating but denies dysuria.  She states that she is not currently drinking alcohol or using any illicit drugs.  Past Medical History:  Diagnosis Date  . Asthma    Pt. stated only when she plays sport.   . Drug addiction [facility])     Patient Active Problem List   Diagnosis Date Noted  . Bipolar affective disorder, current episode mixed (HCC) 03/27/2018  . Biliary colic 04/28/2017  . Cocaine abuse (HCC) 11/05/2016  . Substance induced mood disorder (HCC) 11/05/2016  . Substance induced mood disorder (HCC) 02/06/2016  . Cocaine abuse (HCC) 02/06/2016  . Marijuana abuse 02/06/2016  . Noncompliance 02/06/2016  . 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 CHOLECYSTECTOMY    . GANGLION CYST EXCISION    . NOSE SURGERY      Prior to Admission medications   Medication Sig Start Date End Date Taking? Authorizing Provider  ondansetron (ZOFRAN) 4 MG tablet Take 1 tablet (4 mg total) by mouth daily as needed for nausea or vomiting. 07/24/20 07/24/21  Willy Eddy, MD     Allergies Patient has no known allergies.  Family History  Problem Relation Age of Onset  . Diabetes Mother   . Pancreatic cancer Mother   . Diabetes Father     Social History Social History   Tobacco Use  . Smoking status: Current Every Day Smoker    Packs/day: 1.00    Types: Cigarettes  . Smokeless tobacco: Never Used  Vaping Use  . Vaping Use: Every day  Substance Use Topics  . Alcohol use: No  . Drug use: Yes    Types: Marijuana, Cocaine, Methamphetamines    Comment: meth    Review of Systems  Constitutional: No fever/chills. Eyes: No visual changes. ENT: No sore throat. Cardiovascular: Denies chest pain. Respiratory: Denies shortness of breath. Gastrointestinal: Positive for vomiting and diarrhea.  Genitourinary: Negative for dysuria.  Negative for vaginal discharge. Musculoskeletal: Negative for back pain. Skin: Negative for rash. Neurological: Negative for headaches, focal weakness or numbness.   ____________________________________________   PHYSICAL EXAM:  VITAL SIGNS: ED Triage Vitals  Enc Vitals Group     BP 12/21/20 1715 113/72     Pulse Rate 12/21/20 1715 (!) 122     Resp 12/21/20 1715 19     Temp 12/21/20 1715 97.8 F (36.6 C)     Temp Source 12/21/20 1715 Oral     SpO2 12/21/20 1715 100 %     Weight 12/21/20 1705 110 lb (49.9 kg)     Height 12/21/20 1705 5\' 3"  (1.6 m)  Head Circumference --      Peak Flow --      Pain Score 12/21/20 1705 10     Pain Loc --      Pain Edu? --      Excl. in GC? --     Constitutional: Alert and oriented. Well appearing and in no acute distress. Eyes: Conjunctivae are normal.  No scleral icterus. Head: Atraumatic. Nose: No congestion/rhinnorhea. Mouth/Throat: Mucous membranes are slightly dry.   Neck: Normal range of motion.  Cardiovascular: Normal rate, regular rhythm. Good peripheral circulation. Respiratory: Normal respiratory effort.  No retractions.  Gastrointestinal: Soft with mild  bilateral lower quadrant tenderness.  No distention.  Genitourinary: No flank tenderness. Musculoskeletal: Extremities warm and well perfused.  Neurologic:  Normal speech and language. No gross focal neurologic deficits are appreciated.  Skin:  Skin is warm and dry. No rash noted. Psychiatric: Mood and affect are normal. Speech and behavior are normal.  ____________________________________________   LABS (all labs ordered are listed, but only abnormal results are displayed)  Labs Reviewed  COMPREHENSIVE METABOLIC PANEL - Abnormal; Notable for the following components:      Result Value   Sodium 132 (*)    Potassium 2.8 (*)    Glucose, Bld 111 (*)    Calcium 8.3 (*)    All other components within normal limits  CBC - Abnormal; Notable for the following components:   Hemoglobin 15.8 (*)    HCT 46.2 (*)    All other components within normal limits  LIPASE, BLOOD  URINALYSIS, COMPLETE (UACMP) WITH MICROSCOPIC  HCG, QUANTITATIVE, PREGNANCY  MAGNESIUM  POC URINE PREG, ED   ____________________________________________  EKG   ____________________________________________  RADIOLOGY  CT abdomen/pelvis: Pending  ____________________________________________   PROCEDURES  Procedure(s) performed: No  Procedures  Critical Care performed: No ____________________________________________   INITIAL IMPRESSION / ASSESSMENT AND PLAN / ED COURSE  Pertinent labs & imaging results that were available during my care of the patient were reviewed by me and considered in my medical decision making (see chart for details).  35 year old female with PMH as noted above including history of substance abuse (although the patient states that she is not using any drugs currently) presents with vomiting, diarrhea, and abdominal pain over the last several days.  I reviewed the past medical records in Epic.  The patient was last seen in the ED in November 2021 for vomiting and abdominal pain  along with vaginal discharge and dysuria.  Imaging showed a hemorrhagic ovarian cyst.  She was diagnosed with pyelonephritis and there was some clinical concern for possible PID or TOA although subsequently her GC/CT swabs were negative.  Admission for IV antibiotics was recommended but the patient signed out AMA.  She subsequently presented to the ED on 12/16 and 1/2 but both times left prior to being seen.  On exam the patient is overall well-appearing.  Her vital signs are normal except for tachycardia on arrival which has now improved.  The abdomen is soft with mild bilateral lower quadrant tenderness.  Given the predominance of GI symptoms, overall presentation is most consistent with gastroenteritis, colitis, diverticulitis, or other GI etiology.  I have a low suspicion for pyelonephritis, I do not suspect ovarian torsion, ruptured ovarian cyst, or PID/TOA given the lack of any significant vaginal or GU symptoms.  We will obtain labs, CT abdomen, and reassess.  If there are any concerning pelvic findings on the CT the patient may need a pelvic ultrasound  ----------------------------------------- 8:51 PM  on 12/21/2020 -----------------------------------------  CT abdomen is pending.  I signed the patient out to the oncoming ED physician Dr. Katrinka Blazing ____________________________________________   FINAL CLINICAL IMPRESSION(S) / ED DIAGNOSES  Final diagnoses:  Nausea vomiting and diarrhea  Hypokalemia      NEW MEDICATIONS STARTED DURING THIS VISIT:  New Prescriptions   No medications on file     Note:  This document was prepared using Dragon voice recognition software and may include unintentional dictation errors.    Dionne Bucy, MD 12/21/20 2051

## 2020-12-21 NOTE — ED Provider Notes (Addendum)
I assumed care of this patient approximately 30 p.m.  Please see outgoing providers note for full details regarding patient's initial evaluation assessment.  In brief patient presents for couple days of some nausea vomiting diarrhea as well as some abdominal pain.  Concern initially was for likely gastroenteritis of the plan is to obtain CT abdomen pelvis to rule out other emergent pathology.  hCG is negative and urine does not appear infected.  CBC is unremarkable and CMP is remarkable for hypokalemia without other significant electrolyte or metabolic derangements.  Lipase not consistent with pancreatitis.  CT abdomen pelvis obtained shows possible intussusception likely transient in nature with no evidence of obstruction and fluid throughout the large and small bowel with normal-appearing appendix.  I discussed patient's possible intussusception with on-call surgeon Dr. Maia Plan who advised that this was likely artifactual and that small intestine can have this appearance during active peristalsis from gastroenteritis given absence of any obstructive findings low suspicion for true intussusception at this time.  In addition CT does show evidence of some Hydro salpinx bilaterally without any surrounding inflammatory changes or clear abscess.  Suspect patient's GI symptoms are likely related to gastroenteritis.  Discussed with her concern for hydrosalpinx. GC are negative today. Urine culture sent.  Given she does not have any evaluation of her WBC count and she is hemodynamically stable and has no fever I do not think she requires admission and doube she is septic at this time.  Will write for 14 days of doxy and Flagyl and 10 days of moxifloxacin (this rx was called to pharamcy) and have her see GYN close outpatient basis.  Patient is amenable to this plan.  Her pain is much improved on my reassessment.  We will send urine culture.Discharged in stable condition.    Gilles Chiquito, MD 12/22/20 4580     Gilles Chiquito, MD 12/22/20 1041

## 2020-12-21 NOTE — ED Triage Notes (Signed)
Pt comes into the ED via POV c/o RLQ abdominal pain that started saturday.  Pt states that she was supposed to be admitted due to a problem with her fallopian tube.  Pt having N/V.  Pt ambulatory to triage at this time with even and unlabored respirations.

## 2020-12-22 ENCOUNTER — Other Ambulatory Visit: Payer: Self-pay | Admitting: Pharmacy Technician

## 2020-12-22 LAB — CHLAMYDIA/NGC RT PCR (ARMC ONLY)
Chlamydia Tr: NOT DETECTED
N gonorrhoeae: NOT DETECTED

## 2020-12-23 ENCOUNTER — Other Ambulatory Visit: Payer: Self-pay

## 2020-12-23 MED FILL — Ondansetron HCl Tab 4 MG: ORAL | 3 days supply | Qty: 10 | Fill #0 | Status: CN

## 2020-12-23 MED FILL — Dicyclomine HCl Cap 10 MG: ORAL | 5 days supply | Qty: 20 | Fill #0 | Status: CN

## 2020-12-24 ENCOUNTER — Other Ambulatory Visit: Payer: Self-pay

## 2020-12-24 LAB — URINE CULTURE: Culture: >=100000 — AB

## 2020-12-25 NOTE — Progress Notes (Signed)
ED Antimicrobial Stewardship Positive Culture Follow Up   Rebecca Mcclure is an 35 y.o. female who presented to [facility] on 12/21/2020 with a chief complaint of  Chief Complaint  Patient presents with  . Abdominal Pain    Recent Results (from the past 720 hour(s))  Chlamydia/NGC rt PCR ([facility] only)     Status: None   Collection Time: 12/21/20  8:55 PM   Specimen: Urine  Result Value Ref Range Status   Specimen source GC/Chlam URINE, RANDOM  Final   Chlamydia Tr NOT DETECTED NOT DETECTED Final   N gonorrhoeae NOT DETECTED NOT DETECTED Final    Comment: (NOTE) This CT/NG assay has not been evaluated in patients with a history of  hysterectomy. Performed at [facility], [address] [place], [place] [address]   Urine Culture     Status: Abnormal   Collection Time: 12/21/20  8:55 PM   Specimen: Urine, Catheterized  Result Value Ref Range Status   Specimen Description   Final    URINE, CATHETERIZED Performed at [facility], [address] [place], [place] [address]    Special Requests   Final    NONE Performed at [facility], [address] [place], [place] [address]    Culture >=100,000 COLONIES/mL KLEBSIELLA PNEUMONIAE (A)  Final   Report Status 12/24/2020 FINAL  Final   Organism ID, Bacteria KLEBSIELLA PNEUMONIAE (A)  Final      Susceptibility   Klebsiella pneumoniae - MIC*    AMPICILLIN RESISTANT Resistant     CEFAZOLIN <=4 SENSITIVE Sensitive     CEFEPIME <=0.12 SENSITIVE Sensitive     CEFTRIAXONE <=0.25 SENSITIVE Sensitive     CIPROFLOXACIN <=0.25 SENSITIVE Sensitive     GENTAMICIN <=1 SENSITIVE Sensitive     IMIPENEM <=0.25 SENSITIVE Sensitive     NITROFURANTOIN 64 INTERMEDIATE Intermediate     TRIMETH/SULFA <=20 SENSITIVE Sensitive     AMPICILLIN/SULBACTAM 4 SENSITIVE Sensitive     PIP/TAZO <=4 SENSITIVE Sensitive     * >=100,000 COLONIES/mL KLEBSIELLA PNEUMONIAE    12/25/20 Per ED MD notes- pt discharged with doxycyline,  metronidazole and moxifloxacin (which was noted to have been called into pharmacy).  Called pharmacy([facility] [phone]) to verify moxifloxacin was called in- it was and they still have all her medications, patient has not picked them up yet.   Brownie Nehme A 12/25/2020, 11:44 AM Clinical Pharmacist

## 2020-12-28 ENCOUNTER — Emergency Department
Admission: EM | Admit: 2020-12-28 | Discharge: 2020-12-29 | Disposition: A | Discharge status: Home / Self Care | Payer: Self-pay | Attending: Emergency Medicine | Admitting: Emergency Medicine

## 2020-12-28 ENCOUNTER — Emergency Department: Payer: Self-pay

## 2020-12-28 ENCOUNTER — Other Ambulatory Visit: Payer: Self-pay

## 2020-12-28 DIAGNOSIS — N730 Acute parametritis and pelvic cellulitis: Secondary | ICD-10-CM

## 2020-12-28 DIAGNOSIS — R1031 Right lower quadrant pain: Secondary | ICD-10-CM

## 2020-12-28 DIAGNOSIS — J45909 Unspecified asthma, uncomplicated: Secondary | ICD-10-CM | POA: Insufficient documentation

## 2020-12-28 DIAGNOSIS — R112 Nausea with vomiting, unspecified: Secondary | ICD-10-CM | POA: Insufficient documentation

## 2020-12-28 DIAGNOSIS — F17210 Nicotine dependence, cigarettes, uncomplicated: Secondary | ICD-10-CM | POA: Insufficient documentation

## 2020-12-28 DIAGNOSIS — R197 Diarrhea, unspecified: Secondary | ICD-10-CM | POA: Insufficient documentation

## 2020-12-28 DIAGNOSIS — N7093 Salpingitis and oophoritis, unspecified: Secondary | ICD-10-CM

## 2020-12-28 DIAGNOSIS — N390 Urinary tract infection, site not specified: Secondary | ICD-10-CM

## 2020-12-28 DIAGNOSIS — N739 Female pelvic inflammatory disease, unspecified: Secondary | ICD-10-CM | POA: Insufficient documentation

## 2020-12-28 DIAGNOSIS — N7091 Salpingitis, unspecified: Secondary | ICD-10-CM

## 2020-12-28 LAB — CBC
HCT: 39.4 % (ref 36.0–46.0)
Hemoglobin: 13.2 g/dL (ref 12.0–15.0)
MCH: 31.4 pg (ref 26.0–34.0)
MCHC: 33.5 g/dL (ref 30.0–36.0)
MCV: 93.6 fL (ref 80.0–100.0)
Platelets: 427 10*3/uL — ABNORMAL HIGH (ref 150–400)
RBC: 4.21 MIL/uL (ref 3.87–5.11)
RDW: 13.2 % (ref 11.5–15.5)
WBC: 6.3 10*3/uL (ref 4.0–10.5)
nRBC: 0 % (ref 0.0–0.2)

## 2020-12-28 LAB — URINALYSIS, COMPLETE (UACMP) WITH MICROSCOPIC
Bilirubin Urine: NEGATIVE
Glucose, UA: NEGATIVE mg/dL
Hgb urine dipstick: NEGATIVE
Ketones, ur: NEGATIVE mg/dL
Nitrite: NEGATIVE
Protein, ur: NEGATIVE mg/dL
Specific Gravity, Urine: 1.025 (ref 1.005–1.030)
pH: 6 (ref 5.0–8.0)

## 2020-12-28 LAB — WET PREP, GENITAL
Sperm: NONE SEEN
Trich, Wet Prep: NONE SEEN
Yeast Wet Prep HPF POC: NONE SEEN

## 2020-12-28 LAB — COMPREHENSIVE METABOLIC PANEL
ALT: 13 U/L (ref 0–44)
AST: 17 U/L (ref 15–41)
Albumin: 3.6 g/dL (ref 3.5–5.0)
Alkaline Phosphatase: 48 U/L (ref 38–126)
Anion gap: 8 (ref 5–15)
BUN: 13 mg/dL (ref 6–20)
CO2: 27 mmol/L (ref 22–32)
Calcium: 8.6 mg/dL — ABNORMAL LOW (ref 8.9–10.3)
Chloride: 106 mmol/L (ref 98–111)
Creatinine, Ser: 0.73 mg/dL (ref 0.44–1.00)
GFR, Estimated: >60 mL/min (ref >60)
Glucose, Bld: 102 mg/dL — ABNORMAL HIGH (ref 70–99)
Potassium: 3.5 mmol/L (ref 3.5–5.1)
Sodium: 141 mmol/L (ref 135–145)
Total Bilirubin: 0.3 mg/dL (ref 0.3–1.2)
Total Protein: 7.2 g/dL (ref 6.5–8.1)

## 2020-12-28 LAB — LIPASE, BLOOD: Lipase: 42 U/L (ref 11–51)

## 2020-12-28 LAB — POC URINE PREG, ED: Preg Test, Ur: NEGATIVE

## 2020-12-28 MED ORDER — SODIUM CHLORIDE 0.9 % IV SOLN
1.0000 g | Freq: Once | INTRAVENOUS | Status: AC
  Administered 2020-12-28: 1 g via INTRAVENOUS
  Filled 2020-12-28: qty 10

## 2020-12-28 MED ORDER — DOXYCYCLINE HYCLATE 100 MG PO CAPS: 100.0000 mg | ORAL_CAPSULE | Freq: Two times a day (BID) | ORAL | 0 refills | Status: DC

## 2020-12-28 MED ORDER — ONDANSETRON HCL 4 MG/2ML IJ SOLN
4.0000 mg | Freq: Once | INTRAMUSCULAR | Status: AC
  Administered 2020-12-28: 4 mg via INTRAVENOUS
  Filled 2020-12-28: qty 2

## 2020-12-28 MED ORDER — METRONIDAZOLE 500 MG PO TABS
500.0000 mg | ORAL_TABLET | Freq: Once | ORAL | Status: AC
  Administered 2020-12-28: 500 mg via ORAL
  Filled 2020-12-28: qty 1

## 2020-12-28 MED ORDER — DOXYCYCLINE HYCLATE 100 MG PO TABS
100.0000 mg | ORAL_TABLET | Freq: Once | ORAL | Status: AC
  Administered 2020-12-28: 100 mg via ORAL
  Filled 2020-12-28: qty 1

## 2020-12-28 MED ORDER — METRONIDAZOLE 500 MG PO TABS: 500.0000 mg | ORAL_TABLET | Freq: Two times a day (BID) | ORAL | 0 refills | Status: DC

## 2020-12-28 MED ORDER — LACTATED RINGERS IV BOLUS
1000.0000 mL | Freq: Once | INTRAVENOUS | Status: AC
  Administered 2020-12-28: 1000 mL via INTRAVENOUS

## 2020-12-28 NOTE — ED Notes (Signed)
Chaperoned pelvic exam completed my Dr. Larinda Buttery.

## 2020-12-28 NOTE — ED Notes (Signed)
Pt in US at this time 

## 2020-12-28 NOTE — ED Notes (Signed)
Patient transported to ultrasound.

## 2020-12-28 NOTE — ED Triage Notes (Signed)
Pt reports RLQ abd pain ongoing since 3/31. Seen here and dx with gastritis. Reports pain and n/v/d persists with bright green stool. Patient also reports bilateral flank pain and being told she had an infx between her uterus and ovaries. Pt breathing unlabored in NAD in triage.

## 2020-12-28 NOTE — Discharge Instructions (Addendum)
As we discussed, it is very important that you take both antibiotics as prescribed to prevent this infection from getting worse, to form an abscess, to develop into sepsis which may lead to severe illness, extensive surgery, loss of fertility, or even death.  Please go to [facility] as we discussed to get this prescriptions filled first thing in the morning.  Follow-up on [facility] on Monday.  Return to the emergency room for worsening abdominal pain or fever.

## 2020-12-28 NOTE — ED Notes (Signed)
Patient provided with specimen container and instructed to provide urine sample when able.

## 2020-12-28 NOTE — ED Provider Notes (Signed)
[facility] Emergency Department Provider Note   ____________________________________________   Event Date/Time   First MD Initiated Contact with Patient 12/28/20 2113     (approximate)  I have reviewed the triage vital signs and the nursing notes.   HISTORY  Chief Complaint Abdominal Pain    HPI Rebecca Mcclure is a 35 y.o. female with past medical history of asthma and polysubstance abuse who presents to the ED complaining of abdominal pain.  Patient reports that she has been dealing with persistent pain in the right lower quadrant of her abdomen for about the past month.  It is been associated with intermittent nausea, vomiting, and diarrhea.  She also endorses thin whitish vaginal discharge, but denies any vaginal bleeding.  She states she was evaluated for similar symptoms at the end of last year, told that she has "abnormal fallopian tubes" and that she needed to follow-up with OB/GYN.  She was never able to schedule an appointment because she could not afford the co-pay.  She then returned to the ED 1 week ago for evaluation, at which time CT scan was concerning for intussusception along with hydrosalpinx.  Intussusception thought to be intermittent and not obstructive, associated with a gastroenteritis.  She was started on antibiotics for presumed PID, but states she was never able to pick up these medications due to cost.  She has continued to feel bad with abdominal pain, vaginal discharge, nausea, and vomiting.  She denies any fevers and has not had any cough, chest pain, or shortness of breath.        Past Medical History:  Diagnosis Date  . Asthma    Pt. stated only when she plays sport.   . Drug addiction [facility])     Patient Active Problem List   Diagnosis Date Noted  . Bipolar affective disorder, current episode mixed (HCC) 03/27/2018  . Biliary colic 04/28/2017  . Cocaine abuse (HCC) 11/05/2016  . Substance induced mood disorder (HCC)  11/05/2016  . Substance induced mood disorder (HCC) 02/06/2016  . Cocaine abuse (HCC) 02/06/2016  . Marijuana abuse 02/06/2016  . Noncompliance 02/06/2016  . 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 CHOLECYSTECTOMY    . GANGLION CYST EXCISION    . NOSE SURGERY      Prior to Admission medications   Medication Sig Start Date End Date Taking? Authorizing Provider  doxycycline (VIBRAMYCIN) 100 MG capsule Take 1 capsule (100 mg total) by mouth 2 (two) times daily for 14 days. 12/28/20 01/11/21 Yes Chesley Noon, MD  metroNIDAZOLE (FLAGYL) 500 MG tablet Take 1 tablet (500 mg total) by mouth 2 (two) times daily for 14 days. 12/28/20 01/11/21 Yes Chesley Noon, MD  dicyclomine (BENTYL) 10 MG capsule Take 1 capsule (10 mg total) by mouth 4 (four) times daily for 5 days. 12/21/20 12/26/20  Gilles Chiquito, MD  dicyclomine (BENTYL) 10 MG capsule TAKE 1 CAPSULE (10 MG TOTAL) BY MOUTH 4 (FOUR) TIMES DAILY FOR 5 DAYS. 12/21/20 12/21/21  Gilles Chiquito, MD  ondansetron (ZOFRAN) 4 MG tablet Take 1 tablet (4 mg total) by mouth every 8 (eight) hours as needed for up to 10 doses for nausea or vomiting. 12/21/20   Gilles Chiquito, MD  ondansetron (ZOFRAN) 4 MG tablet TAKE 1 TABLET (4 MG TOTAL) BY MOUTH EVERY 8 (EIGHT) HOURS AS NEEDED FOR UP TO 10 DOSES FOR NAUSEA OR VOMITING. 12/21/20 12/21/21  Gilles Chiquito, MD  Allergies Patient has no known allergies.  Family History  Problem Relation Age of Onset  . Diabetes Mother   . Pancreatic cancer Mother   . Diabetes Father     Social History Social History   Tobacco Use  . Smoking status: Current Every Day Smoker    Packs/day: 1.00    Types: Cigarettes  . Smokeless tobacco: Never Used  Vaping Use  . Vaping Use: Every day  Substance Use Topics  . Alcohol use: No  . Drug use: Yes    Types: Marijuana, Cocaine, Methamphetamines    Comment: meth last use "few days ago"    Review of  Systems  Constitutional: No fever/chills Eyes: No visual changes. ENT: No sore throat. Cardiovascular: Denies chest pain. Respiratory: Denies shortness of breath. Gastrointestinal: Positive for abdominal pain, nausea, vomiting, and diarrhea.  No constipation. Genitourinary: Negative for dysuria.  Positive for vaginal discharge. Musculoskeletal: Negative for back pain. Skin: Negative for rash. Neurological: Negative for headaches, focal weakness or numbness.  ____________________________________________   PHYSICAL EXAM:  VITAL SIGNS: ED Triage Vitals  Enc Vitals Group     BP 12/28/20 2029 (!) 154/106     Pulse Rate 12/28/20 2029 (!) 103     Resp 12/28/20 2029 18     Temp 12/28/20 2029 98.1 F (36.7 C)     Temp Source 12/28/20 2029 Oral     SpO2 12/28/20 2029 94 %     Weight 12/28/20 2026 100 lb (45.4 kg)     Height 12/28/20 2026 5\' 3"  (1.6 m)     Head Circumference --      Peak Flow --      Pain Score 12/28/20 2026 9     Pain Loc --      Pain Edu? --      Excl. in GC? --     Constitutional: Alert and oriented. Eyes: Conjunctivae are normal. Head: Atraumatic. Nose: No congestion/rhinnorhea. Mouth/Throat: Mucous membranes are moist. Neck: Normal ROM Cardiovascular: Normal rate, regular rhythm. Grossly normal heart sounds. Respiratory: Normal respiratory effort.  No retractions. Lungs CTAB. Gastrointestinal: Soft and nontender. No distention. Genitourinary: Thin whitish discharge noted, no cervical motion tenderness noted but right adnexal tenderness noted. Musculoskeletal: No lower extremity tenderness nor edema. Neurologic:  Normal speech and language. No gross focal neurologic deficits are appreciated. Skin:  Skin is warm, dry and intact. No rash noted. Psychiatric: Mood and affect are normal. Speech and behavior are normal.  ____________________________________________   LABS (all labs ordered are listed, but only abnormal results are displayed)  Labs  Reviewed  WET PREP, GENITAL - Abnormal; Notable for the following components:      Result Value   Clue Cells Wet Prep HPF POC PRESENT (*)    WBC, Wet Prep HPF POC MANY (*)    All other components within normal limits  COMPREHENSIVE METABOLIC PANEL - Abnormal; Notable for the following components:   Glucose, Bld 102 (*)    Calcium 8.6 (*)    All other components within normal limits  CBC - Abnormal; Notable for the following components:   Platelets 427 (*)    All other components within normal limits  URINALYSIS, COMPLETE (UACMP) WITH MICROSCOPIC - Abnormal; Notable for the following components:   Color, Urine YELLOW (*)    APPearance HAZY (*)    Leukocytes,Ua LARGE (*)    Bacteria, UA RARE (*)    All other components within normal limits  CHLAMYDIA/NGC RT PCR ([facility] ONLY)  URINE CULTURE  LIPASE, BLOOD  POC URINE PREG, ED    PROCEDURES  Procedure(s) performed (including Critical Care):  Procedures   ____________________________________________   INITIAL IMPRESSION / ASSESSMENT AND PLAN / ED COURSE       35 year old female with past medical history of asthma and polysubstance abuse who presents to the ED for ongoing right lower quadrant abdominal pain with vaginal discharge, nausea, vomiting, and diarrhea.  She has some symptoms consistent with gastroenteritis, but reviewing her chart she has had abnormal pelvic ultrasound in the past concerning for PID.  CT imaging from last week also showed hydrosalpinx concerning for PID.  Urine culture during that visit also grew Klebsiella, we will repeat urine culture.  It appears that when she has been diagnosed with PID in the past she has never completed course of antibiotics.  She has no abdominal tenderness on exam and we will hold off on repeat CT but check pelvic ultrasound to ensure no TOA.  We will treat empirically with Rocephin here in the ED along with doxycycline and Flagyl, which should provide coverage against  Klebsiella.  Labs are reassuring and I doubt appendicitis given chronicity of her pain.  Patient turned over to oncoming provider pending ultrasound results to rule out TOA.  If this is unremarkable, she would be appropriate for discharge home and will be provided with prescription for doxycycline and Flagyl as well as referral to medication management clinic.      ____________________________________________   FINAL CLINICAL IMPRESSION(S) / ED DIAGNOSES  Final diagnoses:  PID (acute pelvic inflammatory disease)  Right lower quadrant abdominal pain  Lower urinary tract infectious disease     ED Discharge Orders         Ordered    doxycycline (VIBRAMYCIN) 100 MG capsule  2 times daily        12/28/20 2257    metroNIDAZOLE (FLAGYL) 500 MG tablet  2 times daily        12/28/20 2257           Note:  This document was prepared using Dragon voice recognition software and may include unintentional dictation errors.   Chesley Noon, MD 12/28/20 [phone]

## 2020-12-28 NOTE — ED Notes (Signed)
Pelvic cart set up at bedside  

## 2020-12-29 MED ORDER — METRONIDAZOLE 500 MG PO TABS: 500.0000 mg | ORAL_TABLET | Freq: Two times a day (BID) | ORAL | 0 refills | Status: AC

## 2020-12-29 MED ORDER — DOXYCYCLINE HYCLATE 100 MG PO CAPS: 100.0000 mg | ORAL_CAPSULE | Freq: Two times a day (BID) | ORAL | 0 refills | Status: AC

## 2020-12-29 NOTE — ED Provider Notes (Signed)
Accepted care of this patient at 11 PM pending transvaginal ultrasound.  Ultrasound shows bilateral salpingitis and pyosalpinx.  No signs of sepsis with normal white count, no fever.  Discussed with Dr. Tiburcio Pea from OB/GYN who said that since patient is not septic we could admit her for IV antibiotics or discharge home on p.o. antibiotics.  Patient was seen here in the end of March for the same symptoms, had a CT concerning for hydrosalpinx and never took the antibiotics that were prescribed.  We talked to the patient who really does not want to stay in the hospital and prefers to go home.  Together we looked at [employer] and found both antibiotics for $15. Patient reports she is able to afford that.  Good Rx coupon was given to her.  Discussed close follow-up with Gyn on Monday for re-evaluation per Dr. Tiburcio Pea recommendations.  We did talk about the importance of taking antibiotics to prevent this infection from getting worse and patient becoming septic requiring a much bigger infection.  We talked about risks of sepsis.  Patient understands and is able to repeat it all back to me.       I have personally reviewed the images performed during this visit and I agree with the Radiologist's read.   Interpretation by Radiologist:  US PELVIC COMPLETE W TRANSVAGINAL AND TORSION R/O  Result Date: 12/28/2020 CLINICAL DATA:  35 year old female with acute pelvic inflammatory disease. EXAM: TRANSABDOMINAL AND TRANSVAGINAL ULTRASOUND OF PELVIS DOPPLER ULTRASOUND OF OVARIES TECHNIQUE: Both transabdominal and transvaginal ultrasound examinations of the pelvis were performed. Transabdominal technique was performed for global imaging of the pelvis including uterus, ovaries, adnexal regions, and pelvic cul-de-sac. It was necessary to proceed with endovaginal exam following the transabdominal exam to visualize the endometrium and ovaries. Color and duplex Doppler ultrasound was utilized to evaluate blood flow to the  ovaries. COMPARISON:  CT abdomen pelvis dated 12/21/2020 and pelvic ultrasound dated 07/24/2020. FINDINGS: Uterus Measurements: 6.2 x 2.9 x 4.1 cm = volume: 37 mL. No fibroids or other mass visualized. Endometrium Thickness: 7 mm.  No focal abnormality visualized. Right ovary Measurements: 2.0 x 0.7 x 2.2 cm = volume: 1.5 mL. Normal appearance/no adnexal mass. Left ovary Not visualized. Dilated tubular structures with low-level echogenic debris in the region of the adnexa bilaterally most consistent with pyosalpinx and salpingitis. Clinical correlation and follow-up recommended. Pulsed Doppler evaluation of the right ovary demonstrates normal low-resistance arterial and venous waveforms. Other findings No abnormal free fluid. IMPRESSION: 1. Findings most consistent with bilateral salpingitis and pyosalpinx. 2. Unremarkable right ovary.  Nonvisualization of the left ovary. 3. Unremarkable uterus and endometrium. Electronically Signed   By: Elgie Collard M.D.   On: 12/28/2020 23:50      Nita Sickle, MD 12/29/20 [phone]

## 2020-12-30 LAB — URINE CULTURE

## 2021-01-01 ENCOUNTER — Other Ambulatory Visit: Payer: Self-pay

## 2021-07-20 ENCOUNTER — Ambulatory Visit: Payer: Self-pay | Admitting: Family Medicine

## 2021-07-20 ENCOUNTER — Other Ambulatory Visit: Payer: Self-pay

## 2021-07-20 DIAGNOSIS — Z113 Encounter for screening for infections with a predominantly sexual mode of transmission: Secondary | ICD-10-CM

## 2021-07-20 LAB — WET PREP FOR TRICH, YEAST, CLUE
Trichomonas Exam: NEGATIVE
Yeast Exam: NEGATIVE

## 2021-07-20 NOTE — Progress Notes (Signed)
Pt here for STD screening.  Pt c/o vaginal itching and "milky/pasty" discharge x 2 weeks.  Pt declines any pain.  Wet mount reviewed, no treatment required, per SO.  Pt also believes she has a UTI.  Informed pt to f/u with PCP or Urgent care for testing and treatment of UTI. Berdie Ogren, RN

## 2021-07-30 NOTE — Progress Notes (Signed)
Attestation of Attending Supervision of clinical support staff: I agree with the care provided to this patient and was available for any consultation.  I have reviewed the RN's note and chart. I was off site on the date of service and all providers that would typically provide services called off. I was consulted to assure appropriate continuation of services for [facility] clients.    Federico Flake, MD, MPH, [facility] Medical Director

## 2021-10-11 ENCOUNTER — Emergency Department
Admission: EM | Admit: 2021-10-11 | Discharge: 2021-10-11 | Discharge status: Against Medical Advice | Payer: Self-pay | Source: Home / Self Care

## 2021-10-12 ENCOUNTER — Emergency Department
Admission: EM | Admit: 2021-10-12 | Discharge: 2021-10-12 | Disposition: A | Discharge status: Home / Self Care | Payer: Self-pay | Attending: Emergency Medicine | Admitting: Emergency Medicine

## 2021-10-12 ENCOUNTER — Other Ambulatory Visit: Payer: Self-pay

## 2021-10-12 DIAGNOSIS — N939 Abnormal uterine and vaginal bleeding, unspecified: Secondary | ICD-10-CM | POA: Insufficient documentation

## 2021-10-12 LAB — CBC
HCT: 42.3 % (ref 36.0–46.0)
Hemoglobin: 13.5 g/dL (ref 12.0–15.0)
MCH: 30.9 pg (ref 26.0–34.0)
MCHC: 31.9 g/dL (ref 30.0–36.0)
MCV: 96.8 fL (ref 80.0–100.0)
Platelets: 462 10*3/uL — ABNORMAL HIGH (ref 150–400)
RBC: 4.37 MIL/uL (ref 3.87–5.11)
RDW: 13.2 % (ref 11.5–15.5)
WBC: 12.5 10*3/uL — ABNORMAL HIGH (ref 4.0–10.5)
nRBC: 0 % (ref 0.0–0.2)

## 2021-10-12 LAB — URINALYSIS, ROUTINE W REFLEX MICROSCOPIC
Bacteria, UA: NONE SEEN
Bilirubin Urine: NEGATIVE
Glucose, UA: NEGATIVE mg/dL
Hgb urine dipstick: NEGATIVE
Ketones, ur: NEGATIVE mg/dL
Nitrite: NEGATIVE
Protein, ur: NEGATIVE mg/dL
Specific Gravity, Urine: 1.014 (ref 1.005–1.030)
pH: 5 (ref 5.0–8.0)

## 2021-10-12 LAB — TYPE AND SCREEN
ABO/RH(D): A POS
Antibody Screen: NEGATIVE

## 2021-10-12 LAB — BASIC METABOLIC PANEL
Anion gap: 6 (ref 5–15)
BUN: 12 mg/dL (ref 6–20)
CO2: 27 mmol/L (ref 22–32)
Calcium: 9.5 mg/dL (ref 8.9–10.3)
Chloride: 106 mmol/L (ref 98–111)
Creatinine, Ser: 0.78 mg/dL (ref 0.44–1.00)
GFR, Estimated: >60 mL/min (ref >60)
Glucose, Bld: 140 mg/dL — ABNORMAL HIGH (ref 70–99)
Potassium: 4.6 mmol/L (ref 3.5–5.1)
Sodium: 139 mmol/L (ref 135–145)

## 2021-10-12 LAB — POC URINE PREG, ED: Preg Test, Ur: NEGATIVE

## 2021-10-12 MED ORDER — DOXYCYCLINE MONOHYDRATE 100 MG PO TABS: 100.0000 mg | ORAL_TABLET | Freq: Two times a day (BID) | ORAL | 0 refills | Status: DC

## 2021-10-12 MED ORDER — METRONIDAZOLE 500 MG PO TABS: 500.0000 mg | ORAL_TABLET | Freq: Two times a day (BID) | ORAL | 0 refills | Status: AC

## 2021-10-12 NOTE — ED Provider Notes (Signed)
[facility] Provider Note    Event Date/Time   First MD Initiated Contact with Patient 10/12/21 0815     (approximate)   History   Vaginal Bleeding   HPI  Rebecca Mcclure is a 36 y.o. female presents emergency department with vaginal bleeding for approximately 1 month.  Patient states that earlier in the month she was bleeding through super tampon in less than an hour.  States the bleeding has stopped until she sees spotting on 12 paper when she wipes.  Patient states that she was told she had an infection in one of her fallopian tubes.  She lost her medication and did not finish her medicine.  Denies vaginal discharge.  Denies pelvic pain.  States she just has pain all over her abdomen most of the time.      Physical Exam   Triage Vital Signs: ED Triage Vitals  Enc Vitals Group     BP 10/12/21 0231 (!) 136/97     Pulse Rate 10/12/21 0231 82     Resp 10/12/21 0231 18     Temp 10/12/21 0231 97.6 F (36.4 C)     Temp Source 10/12/21 0231 Oral     SpO2 10/12/21 0231 100 %     Weight 10/12/21 0239 110 lb (49.9 kg)     Height 10/12/21 0239 5\' 4"  (1.626 m)     Head Circumference --      Peak Flow --      Pain Score 10/12/21 0239 8     Pain Loc --      Pain Edu? --      Excl. in GC? --     Most recent vital signs: Vitals:   10/12/21 0231  BP: (!) 136/97  Pulse: 82  Resp: 18  Temp: 97.6 F (36.4 C)  SpO2: 100%     General: Awake, no distress.   CV:  Good peripheral perfusion. regular rate and  rhythm Resp:  Normal effort. Lungs CTA Abd:  No distention.  Minimally tender throughout the entire abdomen.  No peritoneal signs noted. Other:  Pelvic exam deferred by patient   ED Results / Procedures / Treatments   Labs (all labs ordered are listed, but only abnormal results are displayed) Labs Reviewed  CBC - Abnormal; Notable for the following components:      Result Value   WBC 12.5 (*)    Platelets 462 (*)    All other components  within normal limits  BASIC METABOLIC PANEL - Abnormal; Notable for the following components:   Glucose, Bld 140 (*)    All other components within normal limits  URINALYSIS, ROUTINE W REFLEX MICROSCOPIC - Abnormal; Notable for the following components:   Color, Urine YELLOW (*)    APPearance HAZY (*)    Leukocytes,Ua TRACE (*)    All other components within normal limits  POC URINE PREG, ED - Normal  TYPE AND SCREEN     EKG     RADIOLOGY     PROCEDURES:   Procedures   MEDICATIONS ORDERED IN ED: Medications - No data to display   IMPRESSION / MDM / ASSESSMENT AND PLAN / ED COURSE  I reviewed the triage vital signs and the nursing notes.                              Differential diagnosis includes, but is not limited to, PID, menorrhagia, fibroids  Labs  are reassuring, POC pregnancy is negative, basic metabolic panel is normal, CBC is normal, type and screen is a positive, urinalysis is negative  Patient is refusing swabs for GC/chlamydia and wet prep. I did offer to do a repeat ultrasound which she states she does not think it is necessary.  States she really just wants to get a refill on her medications that she did not finish.  She is to follow-up with the health department.  Or she may follow-up with Dr. Valentino Saxon.  She was discharged stable condition.      FINAL CLINICAL IMPRESSION(S) / ED DIAGNOSES   Final diagnoses:  Vaginal bleeding     Rx / DC Orders   ED Discharge Orders          Ordered    metroNIDAZOLE (FLAGYL) 500 MG tablet  2 times daily        10/12/21 0826    doxycycline (ADOXA) 100 MG tablet  2 times daily        10/12/21 3154             Note:  This document was prepared using Dragon voice recognition software and may include unintentional dictation errors.    Faythe Ghee, PA-C 10/12/21 0086    Delton Prairie, MD 10/12/21 [phone]

## 2021-10-12 NOTE — ED Triage Notes (Signed)
Pt presents to ER c/o vaginal bleeding x1 month.  Pt states she started her period over a month ago and has been bleeding since then.  Pt states bleeding has become lighter over last few days but is still having some light spotting.  Pt A&O x4 at this time in NAD.

## 2021-10-12 NOTE — Discharge Instructions (Signed)
Follow-up with either the [facility] department or Dr. Valentino Saxon.  Return emergency department worsening.  Take medications as prescribed.

## 2021-10-12 NOTE — ED Notes (Addendum)
Admits to intercourse 2 days ago. 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 C/o thin white foul d/c. Reports bleeding and clots resolved.  Pinpoints discomfort to RLQ. Reports h/o fallopian tube/ovary issue in the past "never fixed".

## 2022-10-22 DIAGNOSIS — Z79899 Other long term (current) drug therapy: Secondary | ICD-10-CM | POA: Diagnosis not present

## 2022-10-22 DIAGNOSIS — F902 Attention-deficit hyperactivity disorder, combined type: Secondary | ICD-10-CM | POA: Diagnosis not present

## 2022-10-22 DIAGNOSIS — F1490 Cocaine use, unspecified, uncomplicated: Secondary | ICD-10-CM | POA: Diagnosis not present

## 2022-10-22 DIAGNOSIS — R69 Illness, unspecified: Secondary | ICD-10-CM | POA: Diagnosis not present

## 2022-11-19 DIAGNOSIS — F902 Attention-deficit hyperactivity disorder, combined type: Secondary | ICD-10-CM | POA: Diagnosis not present

## 2022-12-31 DIAGNOSIS — F902 Attention-deficit hyperactivity disorder, combined type: Secondary | ICD-10-CM | POA: Diagnosis not present

## 2022-12-31 DIAGNOSIS — F419 Anxiety disorder, unspecified: Secondary | ICD-10-CM | POA: Diagnosis not present

## 2023-02-06 ENCOUNTER — Emergency Department
Admission: EM | Admit: 2023-02-06 | Discharge: 2023-02-06 | Disposition: A | Discharge status: Home / Self Care | Payer: 59 | Attending: Emergency Medicine | Admitting: Emergency Medicine

## 2023-02-06 ENCOUNTER — Other Ambulatory Visit: Payer: Self-pay

## 2023-02-06 DIAGNOSIS — S80861A Insect bite (nonvenomous), right lower leg, initial encounter: Secondary | ICD-10-CM | POA: Diagnosis not present

## 2023-02-06 DIAGNOSIS — W57XXXA Bitten or stung by nonvenomous insect and other nonvenomous arthropods, initial encounter: Secondary | ICD-10-CM | POA: Insufficient documentation

## 2023-02-06 DIAGNOSIS — Z202 Contact with and (suspected) exposure to infections with a predominantly sexual mode of transmission: Secondary | ICD-10-CM | POA: Diagnosis not present

## 2023-02-06 DIAGNOSIS — S80862A Insect bite (nonvenomous), left lower leg, initial encounter: Secondary | ICD-10-CM | POA: Insufficient documentation

## 2023-02-06 LAB — URINALYSIS, ROUTINE W REFLEX MICROSCOPIC
Bilirubin Urine: NEGATIVE
Glucose, UA: NEGATIVE mg/dL
Ketones, ur: NEGATIVE mg/dL
Nitrite: NEGATIVE
Protein, ur: 30 mg/dL — AB
RBC / HPF: >50 RBC/hpf (ref 0–5)
Specific Gravity, Urine: 1.011 (ref 1.005–1.030)
pH: 9 — ABNORMAL HIGH (ref 5.0–8.0)

## 2023-02-06 LAB — WET PREP, GENITAL
Clue Cells Wet Prep HPF POC: NONE SEEN
Sperm: NONE SEEN
Trich, Wet Prep: NONE SEEN
WBC, Wet Prep HPF POC: <10 (ref <10)
Yeast Wet Prep HPF POC: NONE SEEN

## 2023-02-06 LAB — CHLAMYDIA/NGC RT PCR (ARMC ONLY)
Chlamydia Tr: NOT DETECTED
N gonorrhoeae: NOT DETECTED

## 2023-02-06 MED ORDER — CEFTRIAXONE SODIUM 1 G IJ SOLR
500.0000 mg | Freq: Once | INTRAMUSCULAR | Status: AC
  Administered 2023-02-06: 500 mg via INTRAMUSCULAR
  Filled 2023-02-06: qty 10

## 2023-02-06 MED ORDER — DOXYCYCLINE MONOHYDRATE 100 MG PO TABS: 100.0000 mg | ORAL_TABLET | Freq: Two times a day (BID) | ORAL | 0 refills | Status: DC

## 2023-02-06 NOTE — ED Notes (Signed)
Pt refused discharge vitals 

## 2023-02-06 NOTE — ED Notes (Signed)
Neg preg test 

## 2023-02-06 NOTE — Discharge Instructions (Addendum)
Being treated in the ER is only one step in your treatment and safety! Even if you feel better, you still need to go to all suggested follow-up appointments and take medications as directed.   If you have been prescribed antibiotics, take them exactly as directed. Do not stop taking them because your symptoms have improved. The antibiotic doxycycline prescribed for you should help with all complaints seen for today.   Calamine lotion, hydrocortisone anti-itch cream and/or benadryl cream can help with itching and poison ivy exposure. Use cold compresses for affected area. Hot water will increase irritation, be cautious.  Take over-the-counter Tylenol or Ibuprofen with food or snacks as needed for pain. Take care and be patient!

## 2023-02-06 NOTE — ED Provider Notes (Signed)
[facility] Emergency Department Provider Note     Event Date/Time   First MD Initiated Contact with Patient 02/06/23 1549     (approximate)   History   Insect Bite   HPI  Rebecca Mcclure is a 37 y.o. female with a past medical history of drug addiction who presents to the ED with complaint of possible insect bite x 1 week.  She reports removing several ticks off of both lower extremities since last week.  [place] and ticks are commonly seen.  She also reports a spider falling from her ceiling and landing on her leg last night.  She also has concerns of poison ivy on her back that she describes as very " itchy ".  She denies fever, joint pain, chest pain, shortness of breath.  Patient would like to be evaluated for an STD.  She reports vaginal discharge and abnormal odor x 2 days.  Patient is sexually active.  Patient denies contraceptive barrier methods.  Patient is active with 1 partner at this time in which she reports may also need to be checked.  She denies abnormal color discharge, dysuria, urinary frequency.     Physical Exam   Triage Vital Signs: ED Triage Vitals  Enc Vitals Group     BP 02/06/23 1435 125/86     Pulse Rate 02/06/23 1435 70     Resp 02/06/23 1435 18     Temp 02/06/23 1435 98 F (36.7 C)     Temp src --      SpO2 02/06/23 1435 96 %     Weight --      Height --      Head Circumference --      Peak Flow --      Pain Score 02/06/23 1421 6     Pain Loc --      Pain Edu? --      Excl. in GC? --     Most recent vital signs: Vitals:   02/06/23 1435  BP: 125/86  Pulse: 70  Resp: 18  Temp: 98 F (36.7 C)  SpO2: 96%   General: Alert. INAD.  Skin:  Multiple erythematous macules and papules with overlying scab consistent with tick bites noted on bilateral LE.  No signs of erythema migrans.  There is a patch like lesion with erythematous base on inferior scapula region resemblance to poison ivy.    Head:  NCAT.  Eyes:  PERRLA. EOMI. Conjunctivae clear.  No icterus. Ears:  EACs patent  Nose:   Nasal septum is midline. Mucosa is moist. No rhinorrhea. Mouth/Throat:No tonsillar swelling or exudates. No erythema. Neck:   Supple. No lymphadenopathy.  CV:  Good peripheral perfusion. RRR.  RESP:  Normal effort. LCTAB.  ABD:  No distention. Soft. Nontender. No CVA tenderness.No masses. GU:   Deferred.  Self swabbed.  MSK:   Patient freely moves all extremities. No warm swollen joints.  NEURO: No focal deficits. Sensation and motor function intact.       ED Results / Procedures / Treatments   Labs (all labs ordered are listed, but only abnormal results are displayed) Labs Reviewed  URINALYSIS, ROUTINE W REFLEX MICROSCOPIC - Abnormal; Notable for the following components:      Result Value   Color, Urine AMBER (*)    APPearance CLOUDY (*)    pH 9.0 (*)    Hgb urine dipstick LARGE (*)    Protein, ur 30 (*)  Leukocytes,Ua TRACE (*)    Bacteria, UA RARE (*)    All other components within normal limits  WET PREP, GENITAL  CHLAMYDIA/NGC RT PCR ([facility] ONLY)             PROCEDURES:  Critical Care performed: No  Procedures   MEDICATIONS ORDERED IN ED: Medications  cefTRIAXone (ROCEPHIN) injection 500 mg (500 mg Intramuscular Given 02/06/23 1809)     IMPRESSION / MDM / ASSESSMENT AND PLAN / ED COURSE  I reviewed the triage vital signs and the nursing notes.                              Differential diagnosis includes, but is not limited to, tick bite-Lyme disease, Rocky spotted mountain fever; localized skin infection, UTI, requests for STD screening.  Patient's presentation is most consistent with acute presentation with potential threat to life or bodily function.  37 year old female presents to the ED for evaluation of multiple tick bites on her lower extremities bilaterally.  She denies any current symptoms of fever chills or rash.  Additionally the patient is requesting  STD screening due to possible exposure.  See HPI.  Given the patient's overall presentation and concerns at today's visit a comprehensive evaluation to rule out possible tickborne illnesses and provide necessary prophylaxis was required.  Given the absence of systemic symptoms and conservative approach with education and prophylactic measures, I believe the patient is appropriate for outpatient follow-up and no further evaluation in the ED is indicated at this time.  Patient is in satisfactory and stable condition for discharge.  Patient's diagnosis is consistent with insect bite and STD exposure. Patient will be discharged home with prescriptions for doxycycline. Patient is to follow up with [facility] clinic as needed or otherwise directed. Patient is given ED precautions to return to the ED for any worsening or new symptoms.  Patient understands and verbalizes agreement with assessment and plan.  Clinical Course as of 02/07/23 0858  Thu Feb 06, 2023  1711 RBC / HPF: >50 Patient is on menstrual cycle at time of ED evaluation. [MH]  1712 Wet prep, genital Reassuring [MH]  1713 Bacteria, UA(!): RARE Will treat with antibiotics for possible uncomplicated cystitis. [MH]  1726 I have decided to treat this patient prophylactically for chlamydia and gonorrhea due to her past history and because I will already be treating the tick bites with doxycycline.  This was a shared decision made  between myself and the patient. [MH]    Clinical Course User Index [MH] Romeo Apple, Sione Baumgarten A, PA-C    FINAL CLINICAL IMPRESSION(S) / ED DIAGNOSES   Final diagnoses:  Insect bite, unspecified site, initial encounter  Exposure to STD     Rx / DC Orders   ED Discharge Orders          Ordered    doxycycline (ADOXA) 100 MG tablet  2 times daily        02/06/23 1751             Note:  This document was prepared using Dragon voice recognition software and may include unintentional dictation errors.     Romeo Apple, Calvert Charland A, PA-C 02/07/23 1610    Pilar Jarvis, MD 02/12/23 [phone]

## 2023-02-06 NOTE — ED Triage Notes (Signed)
Pt comes with c/o insect bite to upper right leg. Pt states he has pulled several ticks off of her. Pt also wishes to be check for STDs. Pt states lower pelvic pain from possible STD.

## 2023-05-14 DIAGNOSIS — F419 Anxiety disorder, unspecified: Secondary | ICD-10-CM | POA: Diagnosis not present

## 2023-05-14 DIAGNOSIS — F902 Attention-deficit hyperactivity disorder, combined type: Secondary | ICD-10-CM | POA: Diagnosis not present

## 2023-08-26 ENCOUNTER — Ambulatory Visit: Payer: 59

## 2023-08-26 ENCOUNTER — Ambulatory Visit: Payer: 59 | Admitting: Podiatry

## 2023-08-26 ENCOUNTER — Encounter: Payer: Self-pay | Admitting: Podiatry

## 2023-08-26 VITALS — Ht 64.0 in | Wt 110.0 lb

## 2023-08-26 DIAGNOSIS — M79672 Pain in left foot: Secondary | ICD-10-CM

## 2023-08-26 DIAGNOSIS — Z01818 Encounter for other preprocedural examination: Secondary | ICD-10-CM

## 2023-08-26 DIAGNOSIS — M216X2 Other acquired deformities of left foot: Secondary | ICD-10-CM

## 2023-08-26 NOTE — Progress Notes (Unsigned)
Subjective:  Patient ID: Rebecca Mcclure, female    DOB: 10-30-1985,  MRN: [id number]  Chief Complaint  Patient presents with   Foot Pain    Pt is here due to bilateral foot pain, pain has been there for 10+ years states she was told that she had bone spurs in both feet and has callouses on the bottom of both feet.    37 y.o. female presents with the above complaint. Patient presents with complaint left fourth and fifth plantarflexed metatarsal.  Patient states that it is painful to walk on is progressive gotten worse she has been ambulating and dealing with this for past 10 years.  Pain scale 7 out of 10 dull aching nature she has gotten treated in the past however now she is able to have this surgically treated.  She has tried shoe gear modification padding protecting offloading none of which has helped take the pressure off.   Review of Systems: Negative except as noted in the HPI. Denies N/V/F/Ch.  Past Medical History:  Diagnosis Date   Asthma    Pt. stated only when she plays sport.    Drug addiction (HCC)     Current Outpatient Medications:    doxycycline (ADOXA) 100 MG tablet, Take 1 tablet (100 mg total) by mouth 2 (two) times daily., Disp: 14 tablet, Rfl: 0   ondansetron (ZOFRAN) 4 MG tablet, Take 1 tablet (4 mg total) by mouth every 8 (eight) hours as needed for up to 10 doses for nausea or vomiting., Disp: 10 tablet, Rfl: 0   dicyclomine (BENTYL) 10 MG capsule, Take 1 capsule (10 mg total) by mouth 4 (four) times daily for 5 days., Disp: 20 capsule, Rfl: 0   dicyclomine (BENTYL) 10 MG capsule, TAKE 1 CAPSULE (10 MG TOTAL) BY MOUTH 4 (FOUR) TIMES DAILY FOR 5 DAYS., Disp: 20 capsule, Rfl: 0  Social History   Tobacco Use  Smoking Status Every Day   Current packs/day: 1.00   Types: Cigarettes  Smokeless Tobacco Never    No Known Allergies Objective:  There were no vitals filed for this visit. Body mass index is 18.88 kg/m. Constitutional Well developed. Well  nourished.  Vascular Dorsalis pedis pulses palpable bilaterally. Posterior tibial pulses palpable bilaterally. Capillary refill normal to all digits.  No cyanosis or clubbing noted. Pedal hair growth normal.  Neurologic Normal speech. Oriented to person, place, and time. Epicritic sensation to light touch grossly present bilaterally.  Dermatologic Nails well groomed and normal in appearance. No open wounds. No skin lesions.  Orthopedic: Left plantarflexed fourth and fifth metatarsal with submetatarsal fourth and fifth pain.  Pain on palpation.   Radiographs: 3 views of skeletally mature adult left foot: Left fourth and fifth plantarflexed metatarsal noted with declination of the metatarsal angle. Assessment:   1. Plantar flexed metatarsal bone of left foot   2. Encounter for preoperative examination for general surgical procedure    Plan:  Patient was evaluated and treated and all questions answered.  Left fourth and fifth plantarflexed metatarsal -All questions and concerns were discussed with the patient in extensive detail given the amount of pain that she is experiencing in setting of past 10 years failing all conservative care including shoe gear modification padding protecting offloading she would like to discuss treatment options for surgical fixation.  I discussed my preoperative intra and postoperative plan with the patient extensive detail patient would like to undergo floating osteotomy of the fourth and fifth. -Informed surgical risk consent was reviewed and  read aloud to the patient.  I reviewed the films.  I have discussed my findings with the patient in great detail.  I have discussed all risks including but not limited to infection, stiffness, scarring, limp, disability, deformity, damage to blood vessels and nerves, numbness, poor healing, need for braces, arthritis, chronic pain, amputation, death.  All benefits and realistic expectations discussed in great detail.  I have  made no promises as to the outcome.  I have provided realistic expectations.  I have offered the patient a 2nd opinion, which they have declined and assured me they preferred to proceed despite the risks   No follow-ups on file.

## 2023-09-22 DIAGNOSIS — F1120 Opioid dependence, uncomplicated: Secondary | ICD-10-CM | POA: Diagnosis not present

## 2023-09-22 DIAGNOSIS — F902 Attention-deficit hyperactivity disorder, combined type: Secondary | ICD-10-CM | POA: Diagnosis not present

## 2023-09-26 ENCOUNTER — Telehealth: Payer: Self-pay | Admitting: Podiatry

## 2023-09-26 NOTE — Telephone Encounter (Signed)
 DOS-10/20/23  MET. OSTEOTOMY 4TH AND [id number]  [employer] EFFECTIVE DATE- 09/24/23  DEDUCTIBLE-$0.00 WITH REMAINING $0.00 OOP-Member's individual out-of-pocket maximum has no limit. COINSURANCE- 0%  PER THE [employer] WEBSITE PORTAL, PRIOR AUTH HAS BEEN APPROVED FOR CPT CODE 71691. GOOD FROM 10/20/23 - 01/18/2024  AUTH REFERENCE #: [id number]

## 2023-09-30 DIAGNOSIS — H5213 Myopia, bilateral: Secondary | ICD-10-CM | POA: Diagnosis not present

## 2023-10-14 ENCOUNTER — Encounter: Payer: Self-pay | Admitting: Family Medicine

## 2023-10-14 ENCOUNTER — Ambulatory Visit (INDEPENDENT_AMBULATORY_CARE_PROVIDER_SITE_OTHER): Payer: Medicaid Other | Admitting: Family Medicine

## 2023-10-14 VITALS — BP 120/68 | HR 88 | Temp 98.2°F | Resp 18 | Ht 64.5 in | Wt 133.8 lb

## 2023-10-14 DIAGNOSIS — E538 Deficiency of other specified B group vitamins: Secondary | ICD-10-CM

## 2023-10-14 DIAGNOSIS — Z114 Encounter for screening for human immunodeficiency virus [HIV]: Secondary | ICD-10-CM | POA: Diagnosis not present

## 2023-10-14 DIAGNOSIS — E559 Vitamin D deficiency, unspecified: Secondary | ICD-10-CM | POA: Diagnosis not present

## 2023-10-14 DIAGNOSIS — D72829 Elevated white blood cell count, unspecified: Secondary | ICD-10-CM | POA: Diagnosis not present

## 2023-10-14 DIAGNOSIS — R7989 Other specified abnormal findings of blood chemistry: Secondary | ICD-10-CM

## 2023-10-14 DIAGNOSIS — Z1231 Encounter for screening mammogram for malignant neoplasm of breast: Secondary | ICD-10-CM

## 2023-10-14 DIAGNOSIS — B182 Chronic viral hepatitis C: Secondary | ICD-10-CM

## 2023-10-14 DIAGNOSIS — Z13220 Encounter for screening for lipoid disorders: Secondary | ICD-10-CM

## 2023-10-14 DIAGNOSIS — J3489 Other specified disorders of nose and nasal sinuses: Secondary | ICD-10-CM

## 2023-10-14 DIAGNOSIS — F1111 Opioid abuse, in remission: Secondary | ICD-10-CM

## 2023-10-14 DIAGNOSIS — F909 Attention-deficit hyperactivity disorder, unspecified type: Secondary | ICD-10-CM

## 2023-10-14 DIAGNOSIS — R7309 Other abnormal glucose: Secondary | ICD-10-CM

## 2023-10-14 DIAGNOSIS — R1011 Right upper quadrant pain: Secondary | ICD-10-CM | POA: Insufficient documentation

## 2023-10-14 DIAGNOSIS — Z720 Tobacco use: Secondary | ICD-10-CM
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
## 2023-10-14 NOTE — Patient Instructions (Signed)
It was a pleasure meeting you today. Thank you for allowing me to take part in your health care.  Our goals for today as we discussed include:  Schedule lab appointment. Fast for 10 hours  Recommend Hepatitis B vaccination.  2 doses 1 month apart.  Recommend Pneumonia 20 vaccine.    This is a list of the screening recommended for you and due dates:  Health Maintenance  Topic Date Due   COVID-19 Vaccine (1) Never done   Pap with HPV screening  Never done   Flu Shot  12/22/2023*   Pneumococcal Vaccination (1 of 2 - PCV) 10/13/2024*   DTaP/Tdap/Td vaccine (2 - Td or Tdap) 05/04/2027   Hepatitis C Screening  Completed   HIV Screening  Completed   HPV Vaccine  Aged Out  *Topic was postponed. The date shown is not the original due date.     If you have any questions or concerns, please do not hesitate to call the office at [phone].  I look forward to our next visit and until then take care and stay safe.  Regards,   Dana Allan, MD   [facility]

## 2023-10-15 ENCOUNTER — Encounter: Payer: Self-pay | Admitting: Family Medicine

## 2023-10-20 ENCOUNTER — Other Ambulatory Visit: Payer: Self-pay

## 2023-10-20 ENCOUNTER — Other Ambulatory Visit: Payer: Self-pay | Admitting: Podiatry

## 2023-10-20 ENCOUNTER — Telehealth: Payer: Self-pay | Admitting: Podiatry

## 2023-10-20 DIAGNOSIS — M21541 Acquired clubfoot, right foot: Secondary | ICD-10-CM | POA: Diagnosis not present

## 2023-10-20 DIAGNOSIS — M21542 Acquired clubfoot, left foot: Secondary | ICD-10-CM | POA: Diagnosis not present

## 2023-10-20 DIAGNOSIS — M216X2 Other acquired deformities of left foot: Secondary | ICD-10-CM | POA: Diagnosis not present

## 2023-10-20 DIAGNOSIS — S92355A Nondisplaced fracture of fifth metatarsal bone, left foot, initial encounter for closed fracture: Secondary | ICD-10-CM | POA: Diagnosis not present

## 2023-10-20 MED ORDER — IBUPROFEN 800 MG PO TABS
800.0000 mg | ORAL_TABLET | Freq: Four times a day (QID) | ORAL | 1 refills | Status: AC | PRN
  Filled 2023-10-20 (×2): qty 60, 15d supply, fill #0

## 2023-10-20 MED ORDER — OXYCODONE-ACETAMINOPHEN 5-325 MG PO TABS
1.0000 | ORAL_TABLET | ORAL | 0 refills | Status: DC | PRN
  Filled 2023-10-20 (×3): qty 30, 5d supply, fill #0

## 2023-10-20 NOTE — Telephone Encounter (Signed)
Pt called to see if any medication was sent in for her and I checked and there were 2 medications called in. She said thank you, she was not aware they were sent in and is starting to have pain.

## 2023-10-20 NOTE — Telephone Encounter (Signed)
Post op Pt. Calling from the pharmacy at [facility] and they do not have prescriptions that was supposed to be called in.

## 2023-10-24 ENCOUNTER — Other Ambulatory Visit: Payer: Self-pay | Admitting: Podiatry

## 2023-10-24 MED ORDER — GABAPENTIN 300 MG PO CAPS: 300.0000 mg | ORAL_CAPSULE | Freq: Three times a day (TID) | ORAL | 3 refills | Status: AC

## 2023-10-26 ENCOUNTER — Encounter: Payer: Self-pay | Admitting: Family Medicine

## 2023-10-26 DIAGNOSIS — J3489 Other specified disorders of nose and nasal sinuses: Secondary | ICD-10-CM | POA: Insufficient documentation

## 2023-10-26 DIAGNOSIS — Z1231 Encounter for screening mammogram for malignant neoplasm of breast: Secondary | ICD-10-CM | POA: Insufficient documentation

## 2023-10-26 DIAGNOSIS — Z720 Tobacco use: Secondary | ICD-10-CM | POA: Insufficient documentation

## 2023-10-26 DIAGNOSIS — R7309 Other abnormal glucose: Secondary | ICD-10-CM | POA: Insufficient documentation

## 2023-10-26 DIAGNOSIS — Z13220 Encounter for screening for lipoid disorders: Secondary | ICD-10-CM | POA: Insufficient documentation

## 2023-10-26 DIAGNOSIS — D72829 Elevated white blood cell count, unspecified: Secondary | ICD-10-CM | POA: Insufficient documentation

## 2023-10-26 DIAGNOSIS — E538 Deficiency of other specified B group vitamins: Secondary | ICD-10-CM | POA: Insufficient documentation

## 2023-10-26 DIAGNOSIS — E559 Vitamin D deficiency, unspecified: Secondary | ICD-10-CM | POA: Insufficient documentation

## 2023-10-26 DIAGNOSIS — F909 Attention-deficit hyperactivity disorder, unspecified type: Secondary | ICD-10-CM | POA: Insufficient documentation

## 2023-10-26 DIAGNOSIS — F1111 Opioid abuse, in remission: Secondary | ICD-10-CM | POA: Insufficient documentation

## 2023-10-26 DIAGNOSIS — Z114 Encounter for screening for human immunodeficiency virus [HIV]: Secondary | ICD-10-CM | POA: Insufficient documentation

## 2023-10-26 NOTE — Assessment & Plan Note (Addendum)
Longstanding history from long term cocaine use, possibly contributing to excessive nasal drainage. No red flags. -Could consider plastics surgery in future

## 2023-10-26 NOTE — Assessment & Plan Note (Signed)
Pack a day smoker since age 38, with a history of a 2.5 year quit period. -Encourage smoking cessation.

## 2023-10-26 NOTE — Assessment & Plan Note (Signed)
Six months clean from heroin, currently on methadone maintenance therapy. -Continue methadone as prescribed by current provider.

## 2023-10-26 NOTE — Assessment & Plan Note (Signed)
Previously treated with Harvoni in 2019.  Followed by ID

## 2023-10-26 NOTE — Assessment & Plan Note (Signed)
Longstanding diagnosis, currently managed with Adderall 15mg  twice daily. History of inconsistent use and dose changes. -Continue Adderall 15mg  twice daily. -Follow up with psychiatry for management

## 2023-10-28 ENCOUNTER — Ambulatory Visit: Payer: Medicaid Other

## 2023-10-28 ENCOUNTER — Ambulatory Visit (INDEPENDENT_AMBULATORY_CARE_PROVIDER_SITE_OTHER): Payer: Medicaid Other

## 2023-10-28 ENCOUNTER — Ambulatory Visit: Payer: Medicaid Other | Admitting: Family Medicine

## 2023-10-28 ENCOUNTER — Encounter: Payer: Self-pay | Admitting: Podiatry

## 2023-10-28 ENCOUNTER — Ambulatory Visit (INDEPENDENT_AMBULATORY_CARE_PROVIDER_SITE_OTHER): Payer: Medicaid Other | Admitting: Podiatry

## 2023-10-28 VITALS — Ht 64.5 in | Wt 133.8 lb

## 2023-10-28 DIAGNOSIS — Z9889 Other specified postprocedural states: Secondary | ICD-10-CM

## 2023-10-28 DIAGNOSIS — M216X2 Other acquired deformities of left foot: Secondary | ICD-10-CM | POA: Diagnosis not present

## 2023-10-28 NOTE — Progress Notes (Signed)
  Subjective:  Patient ID: Rebecca Mcclure, female    DOB: 05/11/1986,  MRN: [id number]  Chief Complaint  Patient presents with   Routine Post Op    Pt is here for 1st post op visit after surgery, X-RAYS done, pt states her foot is ok still haves some occasional pain which is expected, would like callous on the bottom of her left foot shaved.    DOS: 10/20/2023 Procedure: Left fourth and fifth metatarsal floating osteotomy  38 y.o. female returns for post-op check.  Patient states that she is doing well denies any other acute complaints healing normally bandages clean dry and intact ambulating with surgical shoe  Review of Systems: Negative except as noted in the HPI. Denies N/V/F/Ch.  Past Medical History:  Diagnosis Date   Arthritis    Asthma    Pt. stated only when she plays sport.    Depression    Drug addiction (HCC)    Frequent headaches    History of chickenpox    History of stomach ulcers    Hyperlipidemia    Substance abuse (HCC)     Current Outpatient Medications:    amphetamine-dextroamphetamine (ADDERALL) 15 MG tablet, Take 1 tablet by mouth 2 (two) times daily., Disp: , Rfl:    gabapentin  (NEURONTIN ) 300 MG capsule, Take 1 capsule (300 mg total) by mouth 3 (three) times daily., Disp: 90 capsule, Rfl: 3   ibuprofen  (ADVIL ) 800 MG tablet, Take 1 tablet (800 mg total) by mouth every 6 (six) hours as needed., Disp: 60 tablet, Rfl: 1   METHADONE HCL PO, Take 105 mg by mouth daily., Disp: , Rfl:    oxyCODONE -acetaminophen  (PERCOCET) 5-325 MG tablet, Take 1 tablet by mouth every 4 (four) hours as needed for severe pain (pain score 7-10)., Disp: 30 tablet, Rfl: 0  Social History   Tobacco Use  Smoking Status Every Day   Current packs/day: 1.00   Types: Cigarettes  Smokeless Tobacco Never    No Known Allergies Objective:  There were no vitals filed for this visit. Body mass index is 22.6 kg/m. Constitutional Well developed. Well nourished.  Vascular Foot warm  and well perfused. Capillary refill normal to all digits.   Neurologic Normal speech. Oriented to person, place, and time. Epicritic sensation to light touch grossly present bilaterally.  Dermatologic Skin healing well without signs of infection. Skin edges well coapted without signs of infection.  Orthopedic: Tenderness to palpation noted about the surgical site.   Radiographs: 3 views of skeletally mature adult left foot: Fourth and fifth metatarsal floating osteotomy noted Assessment:   1. Plantar flexed metatarsal bone of left foot    Plan:  Patient was evaluated and treated and all questions answered.  S/p foot surgery left -Progressing as expected post-operatively. -XR: None -WB Status: Weightbearing as tolerated in surgical shoe -Sutures: Intact.  No clinical signs of dehiscence noted no complication noted. -Foot redressed.  No follow-ups on file.

## 2023-11-11 ENCOUNTER — Ambulatory Visit (INDEPENDENT_AMBULATORY_CARE_PROVIDER_SITE_OTHER): Payer: Medicaid Other | Admitting: Podiatry

## 2023-11-11 DIAGNOSIS — F902 Attention-deficit hyperactivity disorder, combined type: Secondary | ICD-10-CM | POA: Diagnosis not present

## 2023-11-11 DIAGNOSIS — M216X2 Other acquired deformities of left foot: Secondary | ICD-10-CM

## 2023-11-11 DIAGNOSIS — Z9889 Other specified postprocedural states: Secondary | ICD-10-CM

## 2023-11-11 NOTE — Progress Notes (Signed)
  Subjective:  Patient ID: Rebecca Mcclure, female    DOB: [birth year],  MRN: [id number]  Chief Complaint  Patient presents with   Routine Post Op    POV #2, DOS 10/20/23, LEFT FLOATING OSTEOTOMY OF FOURTH AND FIFTH METATARSAL    DOS: 10/20/2023 Procedure: Left fourth and fifth metatarsal floating osteotomy  38 y.o. female returns for post-op check.  Patient states that she is doing well denies any other acute complaints healing normally bandages clean dry and intact ambulating with surgical shoe  Review of Systems: Negative except as noted in the HPI. Denies N/V/F/Ch.  Past Medical History:  Diagnosis Date   Arthritis    Asthma    Pt. stated only when she plays sport.    Depression    Drug addiction (HCC)    Frequent headaches    History of chickenpox    History of stomach ulcers    Hyperlipidemia    Substance abuse (HCC)     Current Outpatient Medications:    amphetamine-dextroamphetamine (ADDERALL) 15 MG tablet, Take 1 tablet by mouth 2 (two) times daily., Disp: , Rfl:    gabapentin (NEURONTIN) 300 MG capsule, Take 1 capsule (300 mg total) by mouth 3 (three) times daily., Disp: 90 capsule, Rfl: 3   ibuprofen (ADVIL) 800 MG tablet, Take 1 tablet (800 mg total) by mouth every 6 (six) hours as needed., Disp: 60 tablet, Rfl: 1   METHADONE HCL PO, Take 105 mg by mouth daily., Disp: , Rfl:    oxyCODONE-acetaminophen (PERCOCET) 5-325 MG tablet, Take 1 tablet by mouth every 4 (four) hours as needed for severe pain (pain score 7-10)., Disp: 30 tablet, Rfl: 0  Social History   Tobacco Use  Smoking Status Every Day   Current packs/day: 1.00   Types: Cigarettes  Smokeless Tobacco Never    No Known Allergies Objective:  There were no vitals filed for this visit. There is no height or weight on file to calculate BMI. Constitutional Well developed. Well nourished.  Vascular Foot warm and well perfused. Capillary refill normal to all digits.   Neurologic Normal speech. Oriented  to person, place, and time. Epicritic sensation to light touch grossly present bilaterally.  Dermatologic Skin completely epithelialized.  No signs of Deis is noted no complication noted.  Reduction of submetatarsal 4 deformity noted  Orthopedic: Tenderness to palpation noted about the surgical site.   Radiographs: 3 views of skeletally mature adult left foot: Fourth and fifth metatarsal floating osteotomy noted Assessment:   1. Plantar flexed metatarsal bone of left foot   2. Status post foot surgery     Plan:  Patient was evaluated and treated and all questions answered.  S/p foot surgery left -Progressing as expected post-operatively. -XR: None -WB Status: Weightbearing as tolerated in regular shoes -Sutures: Removed no clinical signs of dehiscence noted no complication noted. -Foot redressed.  No follow-ups on file.

## 2023-11-25 ENCOUNTER — Telehealth: Payer: Self-pay | Admitting: Podiatry

## 2023-11-25 NOTE — Telephone Encounter (Signed)
 Patient is stating she spoke with pharmacist and order was not sent. Patient would like to know which location was prescription for Gabapentin (Neurontin) 300 MG capsule sent by Dr. Allena Katz

## 2023-11-26 ENCOUNTER — Other Ambulatory Visit: Payer: Self-pay | Admitting: Podiatry

## 2023-11-26 ENCOUNTER — Other Ambulatory Visit: Payer: Self-pay

## 2023-11-26 MED ORDER — GABAPENTIN 300 MG PO CAPS
300.0000 mg | ORAL_CAPSULE | Freq: Three times a day (TID) | ORAL | 3 refills | Status: AC
  Filled 2023-11-26: qty 90, 30d supply, fill #0

## 2023-11-27 ENCOUNTER — Ambulatory Visit: Payer: Medicaid Other | Admitting: Family Medicine

## 2023-11-27 ENCOUNTER — Telehealth: Payer: Self-pay | Admitting: Family Medicine

## 2023-11-27 ENCOUNTER — Other Ambulatory Visit: Payer: Medicaid Other

## 2023-11-27 NOTE — Telephone Encounter (Signed)
 Dr Clent Ridges is out of the office today,11/27/2023. Your appointment needs to be rescheduled. Please call the office at [phone].  Thank you.

## 2023-11-27 NOTE — Telephone Encounter (Signed)
 Pt scheduled for 12/11/2023

## 2023-11-27 NOTE — Telephone Encounter (Signed)
 Called pt and left a voicemail to give [employer] to get rescheduled. Thanks.

## 2023-12-01 ENCOUNTER — Telehealth: Payer: Self-pay | Admitting: Podiatry

## 2023-12-01 NOTE — Telephone Encounter (Signed)
 Patient is stating prescription for Gabapentin (Neurontin 300 mg capsule) went to the wrong pharmacy, it was sent to [facility], and patient states it should be refilled at [facility]) in [place], [place]. Patient would like to speak with provider or nurse, contact telephone number, [phone]

## 2023-12-02 MED ORDER — GABAPENTIN 300 MG PO CAPS
300.0000 mg | ORAL_CAPSULE | Freq: Three times a day (TID) | ORAL | 3 refills | Status: AC
Start: 2023-12-02 — End: ?

## 2023-12-04 DIAGNOSIS — R197 Diarrhea, unspecified: Secondary | ICD-10-CM | POA: Diagnosis not present

## 2023-12-04 DIAGNOSIS — R112 Nausea with vomiting, unspecified: Secondary | ICD-10-CM | POA: Diagnosis not present

## 2023-12-04 DIAGNOSIS — K529 Noninfective gastroenteritis and colitis, unspecified: Secondary | ICD-10-CM | POA: Diagnosis not present

## 2023-12-05 ENCOUNTER — Other Ambulatory Visit: Payer: Self-pay

## 2023-12-09 ENCOUNTER — Ambulatory Visit (INDEPENDENT_AMBULATORY_CARE_PROVIDER_SITE_OTHER): Payer: Medicaid Other | Admitting: Podiatry

## 2023-12-09 ENCOUNTER — Ambulatory Visit (INDEPENDENT_AMBULATORY_CARE_PROVIDER_SITE_OTHER)

## 2023-12-09 DIAGNOSIS — M216X2 Other acquired deformities of left foot: Secondary | ICD-10-CM | POA: Diagnosis not present

## 2023-12-09 DIAGNOSIS — M216X1 Other acquired deformities of right foot: Secondary | ICD-10-CM

## 2023-12-09 DIAGNOSIS — Z9889 Other specified postprocedural states: Secondary | ICD-10-CM

## 2023-12-09 DIAGNOSIS — Z01818 Encounter for other preprocedural examination: Secondary | ICD-10-CM

## 2023-12-09 NOTE — Progress Notes (Signed)
 Subjective:  Patient ID: Rebecca Mcclure, female    DOB: 02/28/1986,  MRN: [id number]  Chief Complaint  Patient presents with   Routine Post Op    DOS 10/20/23, LEFT FLOATING OSTEOTOMY OF FOURTH AND FIFTH METATARSAL    38 y.o. female presents with the above complaint.  Patient presents with right fourth and fifth metatarsal.  She states the left side is completely healed she would like to schedule surgery for the right side.  She has failed all conservative care on the right side denies any other acute complaints Are 10 Dull Aching Nature Worse with Ambulation Worse with Pressure   Review of Systems: Negative except as noted in the HPI. Denies N/V/F/Ch.  Past Medical History:  Diagnosis Date   Arthritis    Asthma    Pt. stated only when she plays sport.    Depression    Drug addiction (HCC)    Frequent headaches    History of chickenpox    History of stomach ulcers    Hyperlipidemia    Substance abuse (HCC)     Current Outpatient Medications:    amphetamine-dextroamphetamine (ADDERALL) 15 MG tablet, Take 1 tablet by mouth 2 (two) times daily., Disp: , Rfl:    gabapentin (NEURONTIN) 300 MG capsule, Take 1 capsule (300 mg total) by mouth 3 (three) times daily., Disp: 90 capsule, Rfl: 3   gabapentin (NEURONTIN) 300 MG capsule, Take 1 capsule (300 mg total) by mouth 3 (three) times daily., Disp: 90 capsule, Rfl: 3   gabapentin (NEURONTIN) 300 MG capsule, Take 1 capsule (300 mg total) by mouth 3 (three) times daily., Disp: 90 capsule, Rfl: 3   ibuprofen (ADVIL) 800 MG tablet, Take 1 tablet (800 mg total) by mouth every 6 (six) hours as needed., Disp: 60 tablet, Rfl: 1   METHADONE HCL PO, Take 105 mg by mouth daily., Disp: , Rfl:    oxyCODONE-acetaminophen (PERCOCET) 5-325 MG tablet, Take 1 tablet by mouth every 4 (four) hours as needed for severe pain (pain score 7-10)., Disp: 30 tablet, Rfl: 0  Social History   Tobacco Use  Smoking Status Every Day   Current packs/day: 1.00    Types: Cigarettes  Smokeless Tobacco Never    No Known Allergies Objective:  There were no vitals filed for this visit. There is no height or weight on file to calculate BMI. Constitutional Well developed. Well nourished.  Vascular Dorsalis pedis pulses palpable bilaterally. Posterior tibial pulses palpable bilaterally. Capillary refill normal to all digits.  No cyanosis or clubbing noted. Pedal hair growth normal.  Neurologic Normal speech. Oriented to person, place, and time. Epicritic sensation to light touch grossly present bilaterally.  Dermatologic Nails well groomed and normal in appearance. No open wounds. No skin lesions.  Orthopedic: Right plantarflexed fourth and fifth metatarsal with submetatarsal 4 of 5 lesions noted pain on palpation   Radiographs: 3 views of skeletally mature adult right foot: Plantarflexed fourth and fifth metatarsal noted with increased intermetatarsal declination angle. Assessment:   1. Plantar flexed metatarsal bone of left foot    Plan:  Patient was evaluated and treated and all questions answered.  Right plantarflexed fourth and fifth metatarsal -All questions and concerns were discussed with the patient extensive detail given the presence of pronation of the ankle I believe patient will benefit from floating osteotomy of right fourth and fifth metatarsal.  She had undergone the same procedure on the left side is doing extremely well.  She would like to do the right  side.  I discussed my preoperative or postop plan with the patient extensive detail she states understand like to proceed with surgery -Informed surgical risk consent was reviewed and read aloud to the patient.  I reviewed the films.  I have discussed my findings with the patient in great detail.  I have discussed all risks including but not limited to infection, stiffness, scarring, limp, disability, deformity, damage to blood vessels and nerves, numbness, poor healing, need for  braces, arthritis, chronic pain, amputation, death.  All benefits and realistic expectations discussed in great detail.  I have made no promises as to the outcome.  I have provided realistic expectations.  I have offered the patient a 2nd opinion, which they have declined and assured me they preferred to proceed despite the risks   No follow-ups on file.

## 2023-12-11 ENCOUNTER — Ambulatory Visit: Admitting: Family Medicine

## 2023-12-12 DIAGNOSIS — F902 Attention-deficit hyperactivity disorder, combined type: Secondary | ICD-10-CM | POA: Diagnosis not present

## 2024-01-26 ENCOUNTER — Ambulatory Visit: Admitting: Family Medicine

## 2024-01-27 ENCOUNTER — Other Ambulatory Visit (HOSPITAL_COMMUNITY)
Admission: RE | Admit: 2024-01-27 | Discharge: 2024-01-27 | Disposition: A | Discharge status: Home / Self Care | Source: Ambulatory Visit | Attending: Family Medicine | Admitting: Family Medicine

## 2024-01-27 ENCOUNTER — Encounter: Payer: Self-pay | Admitting: Family Medicine

## 2024-01-27 ENCOUNTER — Ambulatory Visit (INDEPENDENT_AMBULATORY_CARE_PROVIDER_SITE_OTHER): Admitting: Family Medicine

## 2024-01-27 VITALS — BP 114/66 | HR 72 | Temp 98.0°F | Resp 20 | Ht 64.5 in | Wt 137.2 lb

## 2024-01-27 DIAGNOSIS — E538 Deficiency of other specified B group vitamins: Secondary | ICD-10-CM | POA: Diagnosis not present

## 2024-01-27 DIAGNOSIS — R7989 Other specified abnormal findings of blood chemistry: Secondary | ICD-10-CM

## 2024-01-27 DIAGNOSIS — Z13220 Encounter for screening for lipoid disorders: Secondary | ICD-10-CM | POA: Diagnosis not present

## 2024-01-27 DIAGNOSIS — E559 Vitamin D deficiency, unspecified: Secondary | ICD-10-CM | POA: Diagnosis not present

## 2024-01-27 DIAGNOSIS — Z114 Encounter for screening for human immunodeficiency virus [HIV]: Secondary | ICD-10-CM | POA: Diagnosis not present

## 2024-01-27 DIAGNOSIS — Z0001 Encounter for general adult medical examination with abnormal findings: Secondary | ICD-10-CM | POA: Diagnosis not present

## 2024-01-27 DIAGNOSIS — Z113 Encounter for screening for infections with a predominantly sexual mode of transmission: Secondary | ICD-10-CM | POA: Diagnosis not present

## 2024-01-27 DIAGNOSIS — Z124 Encounter for screening for malignant neoplasm of cervix: Secondary | ICD-10-CM | POA: Insufficient documentation

## 2024-01-27 DIAGNOSIS — D72829 Elevated white blood cell count, unspecified: Secondary | ICD-10-CM

## 2024-01-27 DIAGNOSIS — R7309 Other abnormal glucose: Secondary | ICD-10-CM | POA: Diagnosis not present

## 2024-01-27 NOTE — Patient Instructions (Addendum)
 It was a pleasure meeting you today. Thank you for allowing me to take part in your health care.  Our goals for today as we discussed include:  Pregnancy test negative  Will MyChart you the results of swabs today.  If needing treatment will send in prescription to your pharmacy.   We will get some labs today.  If they are abnormal or we need to do something about them, I will call you.  If they are normal, I will send you a message on MyChart (if it is active) or a letter in the mail.  If you don't hear from us  in 2 weeks, please call the office at the number below.     This is a list of the screening recommended for you and due dates:  Health Maintenance  Topic Date Due   COVID-19 Vaccine (1) Never done   Pap with HPV screening  02/19/2020   Pneumococcal Vaccination (1 of 2 - PCV) 10/13/2024*   Flu Shot  04/23/2024   DTaP/Tdap/Td vaccine (6 - Td or Tdap) 05/04/2027   Hepatitis C Screening  Completed   HIV Screening  Completed   HPV Vaccine  Aged Out   Meningitis B Vaccine  Aged Out  *Topic was postponed. The date shown is not the original due date.      If you have any questions or concerns, please do not hesitate to call the office at [phone].  I look forward to our next visit and until then take care and stay safe.  Regards,   Valli Gaw, MD   [facility]

## 2024-01-27 NOTE — Progress Notes (Signed)
 SUBJECTIVE:   Chief Complaint  Patient presents with   Gynecologic Exam   HPI Presents for cervical cancer screening   Discussed the use of AI scribe software for clinical note transcription with the patient, who gave verbal consent to proceed.  History of Present Illness Rebecca Mcclure is a 38 year old female who presents for a Pap smear and blood work.  She has a history of abnormal Pap smears that typically required cervical biopsies. She has not undergone any testing since her last boyfriend and is eager to have these tests done.  Her menstrual periods are irregular, with the last period occurring about a month ago. She experiences intermittent vaginal discharge, though not in the last few days. She is not currently sexually active, although she has a boyfriend of one and a half years. The last sexual activity was a few months ago, during which she experienced pain.  She has never been diagnosed with HPV.    PERTINENT PMH / PSH: As above  OBJECTIVE:  BP 114/66   Pulse 72   Temp 98 F (36.7 C)   Resp 20   Ht 5' 4.5" (1.638 m)   Wt 137 lb 4 oz (62.3 kg)   SpO2 99%   BMI 23.20 kg/m    Physical Exam Vitals reviewed. Exam conducted with a chaperone present.  Constitutional:      General: She is not in acute distress.    Appearance: She is not ill-appearing, toxic-appearing or diaphoretic.  Eyes:     General:        Right eye: No discharge.        Left eye: No discharge.     Conjunctiva/sclera: Conjunctivae normal.  Cardiovascular:     Rate and Rhythm: Normal rate.  Pulmonary:     Effort: Pulmonary effort is normal.  Genitourinary:    Exam position: Lithotomy position.     Labia:        Right: No rash, tenderness or lesion.        Left: No rash, tenderness or lesion.      Vagina: Normal. No tenderness.     Cervix: Normal. No cervical motion tenderness.     Uterus: Normal.      Adnexa: Right adnexa normal and left adnexa normal.  Musculoskeletal:         General: Normal range of motion.  Skin:    General: Skin is warm and dry.  Neurological:     General: No focal deficit present.     Mental Status: She is alert and oriented to person, place, and time. Mental status is at baseline.  Psychiatric:        Mood and Affect: Mood normal.        Behavior: Behavior normal.        Thought Content: Thought content normal.        Judgment: Judgment normal.    {Perform Simple Foot Exam  Perform Detailed exam:1} {Insert foot Exam (Optional):30965}      01/27/2024    1:51 PM 10/15/2023    7:51 AM  Depression screen PHQ 2/9  Decreased Interest 0 0  Down, Depressed, Hopeless 0 1  PHQ - 2 Score 0 1  Altered sleeping 0 2  Tired, decreased energy 0 1  Change in appetite 0 0  Feeling bad or failure about yourself  0 1  Trouble concentrating 0 2  Moving slowly or fidgety/restless 0 0  Suicidal thoughts 0 0  PHQ-9 Score 0  7  Difficult doing work/chores Not difficult at all Somewhat difficult      01/27/2024    1:51 PM 10/15/2023    7:51 AM  GAD 7 : Generalized Anxiety Score  Nervous, Anxious, on Edge 0 1  Control/stop worrying 1 2  Worry too much - different things 1 2  Trouble relaxing 0 2  Restless 0 2  Easily annoyed or irritable 1 2  Afraid - awful might happen 0 1  Total GAD 7 Score 3 12  Anxiety Difficulty Not difficult at all Somewhat difficult    ASSESSMENT/PLAN:  Cervical cancer screening Assessment & Plan: Previous abnormal Pap smears, no HPV history. Pain during intercourse, not currently sexually active.Irregular cycles without a pattern. No current discharge, intermittent episodes reported. No new partner.  - Perform Pap smear today with HPV cotesting -STI screening - Consider referral to OB GYN if Pap smear results are abnormal.  Orders: -     Cytology - PAP -     POCT urine pregnancy  Routine screening for STI (sexually transmitted infection) -     RPR  Encounter for screening for HIV -     Hepatitis C RNA  quantitative -     HIV Antibody (routine testing w rflx)  Vitamin B 12 deficiency -     Vitamin B12  Vitamin D  deficiency -     VITAMIN D  25 Hydroxy (Vit-D Deficiency, Fractures)  Lipid screening -     Lipid panel  Leukocytosis, unspecified type -     CBC with Differential/Platelet  Abnormal glucose -     Hemoglobin A1c  Abnormal LFTs -     Comprehensive metabolic panel with GFR    PDMP reviewed  Return if symptoms worsen or fail to improve.  Valli Gaw, MD

## 2024-01-28 LAB — CBC WITH DIFFERENTIAL/PLATELET
Basophils Absolute: 0 10*3/uL (ref 0.0–0.1)
Basophils Relative: 0.6 % (ref 0.0–3.0)
Eosinophils Absolute: 0.2 10*3/uL (ref 0.0–0.7)
Eosinophils Relative: 2.5 % (ref 0.0–5.0)
HCT: 42 % (ref 36.0–46.0)
Hemoglobin: 13.8 g/dL (ref 12.0–15.0)
Lymphocytes Relative: 44.9 % (ref 12.0–46.0)
Lymphs Abs: 3.5 10*3/uL (ref 0.7–4.0)
MCHC: 32.9 g/dL (ref 30.0–36.0)
MCV: 99.1 fl (ref 78.0–100.0)
Monocytes Absolute: 0.6 10*3/uL (ref 0.1–1.0)
Monocytes Relative: 8 % (ref 3.0–12.0)
Neutro Abs: 3.5 10*3/uL (ref 1.4–7.7)
Neutrophils Relative %: 44 % (ref 43.0–77.0)
Platelets: 327 10*3/uL (ref 150.0–400.0)
RBC: 4.24 Mil/uL (ref 3.87–5.11)
RDW: 13 % (ref 11.5–15.5)
WBC: 7.9 10*3/uL (ref 4.0–10.5)

## 2024-01-28 LAB — COMPREHENSIVE METABOLIC PANEL WITH GFR
ALT: 9 U/L (ref 0–35)
AST: 14 U/L (ref 0–37)
Albumin: 4.2 g/dL (ref 3.5–5.2)
Alkaline Phosphatase: 85 U/L (ref 39–117)
BUN: 13 mg/dL (ref 6–23)
CO2: 28 meq/L (ref 19–32)
Calcium: 9.2 mg/dL (ref 8.4–10.5)
Chloride: 104 meq/L (ref 96–112)
Creatinine, Ser: 0.78 mg/dL (ref 0.40–1.20)
GFR: 95.46 mL/min (ref >60.00)
Glucose, Bld: 57 mg/dL — ABNORMAL LOW (ref 70–99)
Potassium: 4.9 meq/L (ref 3.5–5.1)
Sodium: 139 meq/L (ref 135–145)
Total Bilirubin: 0.2 mg/dL (ref 0.2–1.2)
Total Protein: 7.1 g/dL (ref 6.0–8.3)

## 2024-01-28 LAB — LIPID PANEL
Cholesterol: 196 mg/dL (ref 0–200)
HDL: 39.3 mg/dL (ref >39.00)
LDL Cholesterol: 78 mg/dL (ref 0–99)
NonHDL: 156.57
Total CHOL/HDL Ratio: 5
Triglycerides: 391 mg/dL — ABNORMAL HIGH (ref 0.0–149.0)
VLDL: 78.2 mg/dL — ABNORMAL HIGH (ref 0.0–40.0)

## 2024-01-28 LAB — HEMOGLOBIN A1C: Hgb A1c MFr Bld: 6 % (ref 4.6–6.5)

## 2024-01-28 LAB — VITAMIN D 25 HYDROXY (VIT D DEFICIENCY, FRACTURES): VITD: 14.6 ng/mL — ABNORMAL LOW (ref 30.00–100.00)

## 2024-01-28 LAB — VITAMIN B12: Vitamin B-12: 189 pg/mL — ABNORMAL LOW (ref 211–911)

## 2024-01-29 LAB — RPR: RPR Ser Ql: NONREACTIVE

## 2024-01-29 LAB — HIV ANTIBODY (ROUTINE TESTING W REFLEX): HIV 1&2 Ab, 4th Generation: NONREACTIVE

## 2024-01-29 LAB — HEPATITIS C RNA QUANTITATIVE
HCV Quantitative Log: <1.18 {Log_IU}/mL
HCV RNA, PCR, QN: <15 [IU]/mL

## 2024-02-01 ENCOUNTER — Encounter: Payer: Self-pay | Admitting: Family Medicine

## 2024-02-01 DIAGNOSIS — Z124 Encounter for screening for malignant neoplasm of cervix: Secondary | ICD-10-CM | POA: Insufficient documentation

## 2024-02-01 DIAGNOSIS — Z113 Encounter for screening for infections with a predominantly sexual mode of transmission: Secondary | ICD-10-CM | POA: Insufficient documentation

## 2024-02-01 MED ORDER — VITAMIN B-12 1000 MCG PO TABS
1000.0000 ug | ORAL_TABLET | Freq: Every day | ORAL | 3 refills | Status: DC
  Filled 2024-02-01: qty 90, 90d supply, fill #0

## 2024-02-01 MED ORDER — VITAMIN D (ERGOCALCIFEROL) 1.25 MG (50000 UNIT) PO CAPS
50000.0000 [IU] | ORAL_CAPSULE | ORAL | 3 refills | Status: DC
  Filled 2024-02-01: qty 12, 84d supply, fill #0

## 2024-02-01 NOTE — Assessment & Plan Note (Addendum)
 Previous abnormal Pap smears, no HPV history. Pain during intercourse, not currently sexually active.Irregular cycles without a pattern. No current discharge, intermittent episodes reported. No new partner.  - UPT negative - Perform Pap smear today with HPV cotesting - STI screening - Consider referral to OB GYN if Pap smear results are abnormal.

## 2024-02-02 ENCOUNTER — Telehealth: Payer: Self-pay

## 2024-02-02 ENCOUNTER — Other Ambulatory Visit: Payer: Self-pay

## 2024-02-02 ENCOUNTER — Other Ambulatory Visit: Payer: Self-pay | Admitting: Family Medicine

## 2024-02-02 DIAGNOSIS — A599 Trichomoniasis, unspecified: Secondary | ICD-10-CM

## 2024-02-02 DIAGNOSIS — E559 Vitamin D deficiency, unspecified: Secondary | ICD-10-CM

## 2024-02-02 DIAGNOSIS — E538 Deficiency of other specified B group vitamins: Secondary | ICD-10-CM

## 2024-02-02 LAB — CYTOLOGY - PAP
Comment: NEGATIVE
Diagnosis: NEGATIVE
High risk HPV: NEGATIVE

## 2024-02-02 LAB — POCT URINE PREGNANCY: Preg Test, Ur: NEGATIVE

## 2024-02-02 MED ORDER — VITAMIN D (ERGOCALCIFEROL) 1.25 MG (50000 UNIT) PO CAPS: 50000.0000 [IU] | ORAL_CAPSULE | ORAL | 3 refills | Status: AC

## 2024-02-02 MED ORDER — METRONIDAZOLE 500 MG PO TABS: 500.0000 mg | ORAL_TABLET | Freq: Three times a day (TID) | ORAL | 0 refills | Status: AC

## 2024-02-02 MED ORDER — VITAMIN B-12 1000 MCG PO TABS
1000.0000 ug | ORAL_TABLET | Freq: Every day | ORAL | 3 refills | Status: AC
Start: 2024-02-02 — End: ?

## 2024-02-02 NOTE — Telephone Encounter (Signed)
 Left message to return call to our office.  Medication has been resent to [facility].

## 2024-02-02 NOTE — Telephone Encounter (Signed)
 Called and spoke to pt let her know this information.

## 2024-02-02 NOTE — Telephone Encounter (Signed)
 Copied from CRM [id number]. Topic: Clinical - Medication Question >> Feb 02, 2024 12:05 PM Allyne Areola wrote: Reason for CRM: Patient is calling because her cyanocobalamin (VITAMIN B12) 1000 MCG tablet , Vitamin D , Ergocalciferol , (DRISDOL) 1.25 MG (50000 UNIT) CAPS capsule prescriptions were sent to the wrong pharmacy, she would like to know if they can be sent to  [facility] [address] [place], [place] - [address] [address]  Phone: [phone] Fax: [phone].

## 2024-04-07 DIAGNOSIS — F902 Attention-deficit hyperactivity disorder, combined type: Secondary | ICD-10-CM | POA: Diagnosis not present

## 2024-04-15 NOTE — Telephone Encounter (Signed)
 Error

## 2024-05-19 ENCOUNTER — Telehealth: Payer: Self-pay | Admitting: Podiatry

## 2024-05-19 NOTE — Telephone Encounter (Signed)
 DOS- 06/07/2024  4TH + 5TH METATARSAL OSTEOTOMY 2-5 RT- 28308  [employer] MEDICAID EFFECTIVE DATE- 09/24/2023  PER [employer] WEBSITE, NO PRIOR AUTH IS REQUIRED FOR CPT CODE 71691 (2 UNITS). DECISION ID# [id number]

## 2024-06-02 DIAGNOSIS — F902 Attention-deficit hyperactivity disorder, combined type: Secondary | ICD-10-CM | POA: Diagnosis not present

## 2024-06-10 ENCOUNTER — Encounter: Payer: Self-pay | Admitting: Podiatry

## 2024-06-10 ENCOUNTER — Other Ambulatory Visit: Payer: Self-pay | Admitting: Podiatry

## 2024-06-10 DIAGNOSIS — M216X1 Other acquired deformities of right foot: Secondary | ICD-10-CM

## 2024-06-10 DIAGNOSIS — Z9889 Other specified postprocedural states: Secondary | ICD-10-CM

## 2024-06-14 ENCOUNTER — Other Ambulatory Visit: Payer: Self-pay | Admitting: Podiatry

## 2024-06-14 DIAGNOSIS — M21541 Acquired clubfoot, right foot: Secondary | ICD-10-CM | POA: Diagnosis not present

## 2024-06-14 DIAGNOSIS — S92355A Nondisplaced fracture of fifth metatarsal bone, left foot, initial encounter for closed fracture: Secondary | ICD-10-CM | POA: Diagnosis not present

## 2024-06-14 DIAGNOSIS — M21271 Flexion deformity, right ankle and toes: Secondary | ICD-10-CM | POA: Diagnosis not present

## 2024-06-14 DIAGNOSIS — M216X1 Other acquired deformities of right foot: Secondary | ICD-10-CM | POA: Diagnosis not present

## 2024-06-14 MED ORDER — OXYCODONE-ACETAMINOPHEN 5-325 MG PO TABS: 1.0000 | ORAL_TABLET | ORAL | 0 refills | Status: AC | PRN

## 2024-06-14 MED ORDER — IBUPROFEN 800 MG PO TABS: 800.0000 mg | ORAL_TABLET | Freq: Four times a day (QID) | ORAL | 1 refills | Status: AC | PRN

## 2024-06-15 ENCOUNTER — Encounter: Admitting: Podiatry

## 2024-06-16 ENCOUNTER — Telehealth: Payer: Self-pay | Admitting: Podiatry

## 2024-06-16 ENCOUNTER — Encounter: Payer: Self-pay | Admitting: Podiatry

## 2024-06-16 NOTE — Telephone Encounter (Signed)
 Patient wants to know if she can have a note for work. She doesn't want to be at work alone so she wants a note to them not to put her by herself.

## 2024-06-17 ENCOUNTER — Encounter: Payer: Self-pay | Admitting: Podiatry

## 2024-06-17 NOTE — Telephone Encounter (Signed)
 per mess pt needs note. cld and adv can pkup at [facility] office. Restrictions/accommadations 06/18/24-07/23/24 approx.

## 2024-06-18 ENCOUNTER — Telehealth: Payer: Self-pay | Admitting: Podiatry

## 2024-06-18 NOTE — Telephone Encounter (Signed)
 Pt want to know is she can take Adderall 30mg  with pain medication Oxycodone   generic for Percocet 5 - 325mg   best contact for pt [phone]

## 2024-06-18 NOTE — Telephone Encounter (Signed)
 Patient informed not to take Percocet and Adderall together. She verbalized understanding and will call back with any further questions.

## 2024-06-18 NOTE — Telephone Encounter (Signed)
 There are some concerns about these two medications being taken together. Sending to provider for further recommendations and information.

## 2024-06-22 ENCOUNTER — Encounter: Admitting: Podiatry

## 2024-06-24 ENCOUNTER — Ambulatory Visit (INDEPENDENT_AMBULATORY_CARE_PROVIDER_SITE_OTHER)

## 2024-06-24 ENCOUNTER — Ambulatory Visit (INDEPENDENT_AMBULATORY_CARE_PROVIDER_SITE_OTHER): Admitting: Podiatry

## 2024-06-24 DIAGNOSIS — Z0279 Encounter for issue of other medical certificate: Secondary | ICD-10-CM

## 2024-06-24 DIAGNOSIS — M216X1 Other acquired deformities of right foot: Secondary | ICD-10-CM

## 2024-06-24 DIAGNOSIS — Z9889 Other specified postprocedural states: Secondary | ICD-10-CM

## 2024-06-24 NOTE — Progress Notes (Signed)
 Subjective:  Patient ID: Rebecca Mcclure, female    DOB: 18-Sep-1986,  MRN: [id number]  Chief Complaint  Patient presents with   Routine Post Op    POV # 1 DOS 06/14/24 RT 4TH/5TH MET FLOATING OSTEOTOMY    DOS: 06/14/2024 Procedure: Right 4th and 5th metatarsal floating osteotomy  38 y.o. female returns for post-op check.  She states she is doing well no acute complaints.  Bandages clean dry and intact ambulating with surgical shoe.  Pain is controlled  Review of Systems: Negative except as noted in the HPI. Denies N/V/F/Ch.  Past Medical History:  Diagnosis Date   Arthritis    Asthma    Pt. stated only when she plays sport.    Depression    Drug addiction (HCC)    Frequent headaches    History of chickenpox    History of stomach ulcers    Hyperlipidemia    Substance abuse (HCC)     Current Outpatient Medications:    amphetamine-dextroamphetamine (ADDERALL) 30 MG tablet, Take 45 mg by mouth daily. Whole pill in the am and half in the evening, Disp: , Rfl:    cyanocobalamin  (VITAMIN B12) 1000 MCG tablet, Take 1 tablet (1,000 mcg total) by mouth daily., Disp: 90 tablet, Rfl: 3   gabapentin  (NEURONTIN ) 300 MG capsule, Take 1 capsule (300 mg total) by mouth 3 (three) times daily. (Patient not taking: Reported on 01/27/2024), Disp: 90 capsule, Rfl: 3   gabapentin  (NEURONTIN ) 300 MG capsule, Take 1 capsule (300 mg total) by mouth 3 (three) times daily. (Patient not taking: Reported on 01/27/2024), Disp: 90 capsule, Rfl: 3   gabapentin  (NEURONTIN ) 300 MG capsule, Take 1 capsule (300 mg total) by mouth 3 (three) times daily. (Patient not taking: Reported on 01/27/2024), Disp: 90 capsule, Rfl: 3   ibuprofen  (ADVIL ) 800 MG tablet, Take 1 tablet (800 mg total) by mouth every 6 (six) hours as needed., Disp: 60 tablet, Rfl: 1   ibuprofen  (ADVIL ) 800 MG tablet, Take 1 tablet (800 mg total) by mouth every 6 (six) hours as needed., Disp: 60 tablet, Rfl: 1   METHADONE HCL PO, Take 105 mg by mouth  daily., Disp: , Rfl:    oxyCODONE -acetaminophen  (PERCOCET) 5-325 MG tablet, Take 1 tablet by mouth every 4 (four) hours as needed for severe pain (pain score 7-10)., Disp: 30 tablet, Rfl: 0   Vitamin D , Ergocalciferol , (DRISDOL ) 1.25 MG (50000 UNIT) CAPS capsule, Take 1 capsule (50,000 Units total) by mouth every 7 (seven) days., Disp: 12 capsule, Rfl: 3  Social History   Tobacco Use  Smoking Status Every Day   Current packs/day: 1.00   Types: Cigarettes  Smokeless Tobacco Never    No Known Allergies Objective:  There were no vitals filed for this visit. There is no height or weight on file to calculate BMI. Constitutional Well developed. Well nourished.  Vascular Foot warm and well perfused. Capillary refill normal to all digits.   Neurologic Normal speech. Oriented to person, place, and time. Epicritic sensation to light touch grossly present bilaterally.  Dermatologic Skin healing well without signs of infection. Skin edges well coapted without signs of infection.  Orthopedic: Tenderness to palpation noted about the surgical site.   Radiographs: 3 views of skeletally mature the right foot: Osteotomy of the 4th and 5th metatarsal noted.  Reduction of deformity noted.  No other abnormalities identified Assessment:   1. Plantar flexed metatarsal bone of right foot   2. Status post foot surgery  Plan:  Patient was evaluated and treated and all questions answered.  S/p foot surgery right -Progressing as expected post-operatively. -XR: See above -WB Status: Weightbearing as tolerated in surgical shoe -Sutures: Intact.  No clinical signs of dehiscence noted no complication noted. -Medications: None -Foot redressed.  No follow-ups on file.

## 2024-06-25 ENCOUNTER — Other Ambulatory Visit: Payer: Self-pay | Admitting: Medical Genetics

## 2024-06-29 ENCOUNTER — Encounter: Payer: Self-pay | Admitting: Podiatry

## 2024-06-29 ENCOUNTER — Encounter: Admitting: Podiatry

## 2024-06-29 NOTE — Telephone Encounter (Signed)
 Faxed [facility] [phone] and emailed pt [employer] forms for her accomm/restrictions.

## 2024-07-02 NOTE — Telephone Encounter (Signed)
 pt says she has to RTW with no restrictions for 07/07/24. I faxed form to [facility] [phone]

## 2024-07-06 ENCOUNTER — Ambulatory Visit: Admitting: Podiatry

## 2024-07-06 ENCOUNTER — Other Ambulatory Visit
Admission: RE | Admit: 2024-07-06 | Discharge: 2024-07-06 | Disposition: A | Discharge status: Home / Self Care | Payer: Self-pay | Source: Ambulatory Visit | Attending: Medical Genetics | Admitting: Medical Genetics

## 2024-07-06 DIAGNOSIS — M216X1 Other acquired deformities of right foot: Secondary | ICD-10-CM | POA: Diagnosis not present

## 2024-07-06 DIAGNOSIS — Z9889 Other specified postprocedural states: Secondary | ICD-10-CM

## 2024-07-06 DIAGNOSIS — M216X2 Other acquired deformities of left foot: Secondary | ICD-10-CM | POA: Diagnosis not present

## 2024-07-06 NOTE — Progress Notes (Signed)
 Subjective:  Patient ID: Rebecca Mcclure, female    DOB: May 01, 1986,  MRN: [id number]  Chief Complaint  Patient presents with   Routine Post Op    She has been doing great. No pain.    DOS: 06/14/2024 Procedure: Right 4th and 5th metatarsal floating osteotomy  38 y.o. female returns for post-op check.  She states she is doing well no acute complaints.  She is ambulating with surgical shoe to the right foot denies any other acute issues.  Review of Systems: Negative except as noted in the HPI. Denies N/V/F/Ch.  Past Medical History:  Diagnosis Date   Arthritis    Asthma    Pt. stated only when she plays sport.    Depression    Drug addiction (HCC)    Frequent headaches    History of chickenpox    History of stomach ulcers    Hyperlipidemia    Substance abuse (HCC)     Current Outpatient Medications:    amphetamine-dextroamphetamine (ADDERALL) 30 MG tablet, Take 45 mg by mouth daily. Whole pill in the am and half in the evening, Disp: , Rfl:    cyanocobalamin  (VITAMIN B12) 1000 MCG tablet, Take 1 tablet (1,000 mcg total) by mouth daily., Disp: 90 tablet, Rfl: 3   gabapentin  (NEURONTIN ) 300 MG capsule, Take 1 capsule (300 mg total) by mouth 3 (three) times daily. (Patient not taking: Reported on 01/27/2024), Disp: 90 capsule, Rfl: 3   gabapentin  (NEURONTIN ) 300 MG capsule, Take 1 capsule (300 mg total) by mouth 3 (three) times daily. (Patient not taking: Reported on 01/27/2024), Disp: 90 capsule, Rfl: 3   gabapentin  (NEURONTIN ) 300 MG capsule, Take 1 capsule (300 mg total) by mouth 3 (three) times daily. (Patient not taking: Reported on 01/27/2024), Disp: 90 capsule, Rfl: 3   ibuprofen  (ADVIL ) 800 MG tablet, Take 1 tablet (800 mg total) by mouth every 6 (six) hours as needed., Disp: 60 tablet, Rfl: 1   ibuprofen  (ADVIL ) 800 MG tablet, Take 1 tablet (800 mg total) by mouth every 6 (six) hours as needed., Disp: 60 tablet, Rfl: 1   METHADONE HCL PO, Take 105 mg by mouth daily., Disp: , Rfl:     oxyCODONE -acetaminophen  (PERCOCET) 5-325 MG tablet, Take 1 tablet by mouth every 4 (four) hours as needed for severe pain (pain score 7-10)., Disp: 30 tablet, Rfl: 0   Vitamin D , Ergocalciferol , (DRISDOL ) 1.25 MG (50000 UNIT) CAPS capsule, Take 1 capsule (50,000 Units total) by mouth every 7 (seven) days., Disp: 12 capsule, Rfl: 3  Social History   Tobacco Use  Smoking Status Every Day   Current packs/day: 1.00   Types: Cigarettes  Smokeless Tobacco Never    No Known Allergies Objective:  There were no vitals filed for this visit. There is no height or weight on file to calculate BMI. Constitutional Well developed. Well nourished.  Vascular Foot warm and well perfused. Capillary refill normal to all digits.   Neurologic Normal speech. Oriented to person, place, and time. Epicritic sensation to light touch grossly present bilaterally.  Dermatologic Skin healing well without signs of infection. Skin edges well coapted without signs of infection.  Orthopedic: Tenderness to palpation noted about the surgical site.   Radiographs: 3 views of skeletally mature the right foot: Osteotomy of the 4th and 5th metatarsal noted.  Reduction of deformity noted.  No other abnormalities identified Assessment:   No diagnosis found.  Plan:  Patient was evaluated and treated and all questions answered.  S/p foot surgery  right - Clinically healed and officially discharged from the care if any foot and ankle issues or in future she will come back and see me.  At this time I discussed shoe gear modification and reduction technique.  I discussed this with extensive detail she states understanding.  Pes planovalgus/foot deformity -I explained to patient the etiology of pes planovalgus and relationship with heel pain/arch pain and various treatment options were discussed.  Given patient foot structure in the setting of heel pain/arch pain I believe patient will benefit from custom-made orthotics to  help control the hindfoot motion support the arch of the foot and take the stress away from arches.  Patient agrees with the plan like to proceed with orthotics -Patient was casted for orthotics   No follow-ups on file.

## 2024-07-16 LAB — GENECONNECT MOLECULAR SCREEN: Genetic Analysis Overall Interpretation: NEGATIVE

## 2024-08-04 DIAGNOSIS — F902 Attention-deficit hyperactivity disorder, combined type: Secondary | ICD-10-CM | POA: Diagnosis not present

## 2024-08-14 DIAGNOSIS — N390 Urinary tract infection, site not specified: Secondary | ICD-10-CM | POA: Diagnosis not present

## 2024-08-14 DIAGNOSIS — R350 Frequency of micturition: Secondary | ICD-10-CM | POA: Diagnosis not present

## 2024-08-14 DIAGNOSIS — R300 Dysuria: Secondary | ICD-10-CM | POA: Diagnosis not present

## 2024-08-14 DIAGNOSIS — R3915 Urgency of urination: Secondary | ICD-10-CM | POA: Diagnosis not present

## 2024-08-25 NOTE — Telephone Encounter (Signed)
 Prentice- Can you please assist this patient of Dr. Anthony? Thanks!

## 2024-09-07 ENCOUNTER — Telehealth: Payer: Self-pay

## 2024-09-07 NOTE — Telephone Encounter (Signed)
 Patient's orthotics are in and being sent to [facility].

## 2024-09-20 ENCOUNTER — Encounter: Payer: Self-pay | Admitting: Podiatry

## 2024-10-11 ENCOUNTER — Encounter

## 2024-10-22 ENCOUNTER — Telehealth: Payer: Self-pay | Admitting: Podiatry

## 2024-10-22 NOTE — Telephone Encounter (Signed)
 Manus Sleeper came in and picked up the patient's orthotics. He paid $29.00

## 2025-01-12 ENCOUNTER — Encounter
